# Patient Record
Sex: Female | Born: 1980 | Hispanic: No | Marital: Married | Smoking: Never smoker
Health system: Southern US, Community
[De-identification: ages and names within clinical notes are randomized; demographics above are authoritative.]

## PROBLEM LIST (undated history)

## (undated) ENCOUNTER — Inpatient Hospital Stay (HOSPITAL_COMMUNITY): Payer: Self-pay

## (undated) DIAGNOSIS — Z3490 Encounter for supervision of normal pregnancy, unspecified, unspecified trimester: Secondary | ICD-10-CM

## (undated) DIAGNOSIS — O24419 Gestational diabetes mellitus in pregnancy, unspecified control: Secondary | ICD-10-CM

## (undated) DIAGNOSIS — Z789 Other specified health status: Secondary | ICD-10-CM

## (undated) DIAGNOSIS — E785 Hyperlipidemia, unspecified: Secondary | ICD-10-CM

## (undated) HISTORY — PX: NO PAST SURGERIES: SHX2092

## (undated) HISTORY — DX: Other specified health status: Z78.9

## (undated) HISTORY — DX: Gestational diabetes mellitus in pregnancy, unspecified control: O24.419

---

## 2012-02-01 ENCOUNTER — Encounter (HOSPITAL_COMMUNITY): Payer: Self-pay | Admitting: *Deleted

## 2012-02-01 ENCOUNTER — Emergency Department (HOSPITAL_COMMUNITY)
Admission: EM | Admit: 2012-02-01 | Discharge: 2012-02-01 | Disposition: A | Discharge status: Home / Self Care | Payer: Medicaid Other | Attending: Emergency Medicine | Admitting: Emergency Medicine

## 2012-02-01 DIAGNOSIS — M549 Dorsalgia, unspecified: Secondary | ICD-10-CM

## 2012-02-01 DIAGNOSIS — E785 Hyperlipidemia, unspecified: Secondary | ICD-10-CM | POA: Insufficient documentation

## 2012-02-01 HISTORY — DX: Hyperlipidemia, unspecified: E78.5

## 2012-02-01 MED ORDER — OXYCODONE-ACETAMINOPHEN 5-325 MG PO TABS: 2.0000 | ORAL_TABLET | ORAL | Status: AC | PRN

## 2012-02-01 MED ORDER — PREDNISONE 20 MG PO TABS: 40.0000 mg | ORAL_TABLET | Freq: Every day | ORAL | Status: AC

## 2012-02-01 MED ORDER — IBUPROFEN 600 MG PO TABS: 600.0000 mg | ORAL_TABLET | Freq: Four times a day (QID) | ORAL | Status: AC | PRN

## 2012-02-01 NOTE — Discharge Instructions (Signed)
Back Pain, Adult Low back pain is very common. About 1 in 5 people have back pain.The cause of low back pain is rarely dangerous. The pain often gets better over time.About half of people with a sudden onset of back pain feel better in just 2 weeks. About 8 in 10 people feel better by 6 weeks.  CAUSES Some common causes of back pain include:  Strain of the muscles or ligaments supporting the spine.   Wear and tear (degeneration) of the spinal discs.   Arthritis.   Direct injury to the back.  DIAGNOSIS Most of the time, the direct cause of low back pain is not known.However, back pain can be treated effectively even when the exact cause of the pain is unknown.Answering your caregiver's questions about your overall health and symptoms is one of the most accurate ways to make sure the cause of your pain is not dangerous. If your caregiver needs more information, he or she may order lab work or imaging tests (X-rays or MRIs).However, even if imaging tests show changes in your back, this usually does not require surgery. HOME CARE INSTRUCTIONS For many people, back pain returns.Since low back pain is rarely dangerous, it is often a condition that people can learn to manageon their own.   Remain active. It is stressful on the back to sit or stand in one place. Do not sit, drive, or stand in one place for more than 30 minutes at a time. Take short walks on level surfaces as soon as pain allows.Try to increase the length of time you walk each day.   Do not stay in bed.Resting more than 1 or 2 days can delay your recovery.   Do not avoid exercise or work.Your body is made to move.It is not dangerous to be active, even though your back may hurt.Your back will likely heal faster if you return to being active before your pain is gone.   Pay attention to your body when you bend and lift. Many people have less discomfortwhen lifting if they bend their knees, keep the load close to their  bodies,and avoid twisting. Often, the most comfortable positions are those that put less stress on your recovering back.   Find a comfortable position to sleep. Use a firm mattress and lie on your side with your knees slightly bent. If you lie on your back, put a pillow under your knees.   Only take over-the-counter or prescription medicines as directed by your caregiver. Over-the-counter medicines to reduce pain and inflammation are often the most helpful.Your caregiver may prescribe muscle relaxant drugs.These medicines help dull your pain so you can more quickly return to your normal activities and healthy exercise.   Put ice on the injured area.   Put ice in a plastic bag.   Place a towel between your skin and the bag.   Leave the ice on for 15 to 20 minutes, 3 to 4 times a day for the first 2 to 3 days. After that, ice and heat may be alternated to reduce pain and spasms.   Ask your caregiver about trying back exercises and gentle massage. This may be of some benefit.   Avoid feeling anxious or stressed.Stress increases muscle tension and can worsen back pain.It is important to recognize when you are anxious or stressed and learn ways to manage it.Exercise is a great option.  SEEK MEDICAL CARE IF:  You have pain that is not relieved with rest or medicine.   You have   pain that does not improve in 1 week.   You have new symptoms.   You are generally not feeling well.  SEEK IMMEDIATE MEDICAL CARE IF:   You have pain that radiates from your back into your legs.   You develop new bowel or bladder control problems.   You have unusual weakness or numbness in your arms or legs.   You develop nausea or vomiting.   You develop abdominal pain.   You feel faint.  Document Released: 07/26/2005 Document Revised: 07/15/2011 Document Reviewed: 12/14/2010 [employer] Patient Information 2012 [employer]. 

## 2012-02-01 NOTE — ED Provider Notes (Signed)
History   This chart was scribed for Nelia Shi, MD by Sofie Rower. The patient was seen in room TR07C/TR07C and the patient's care was started at 1:20 PM     CSN: [id number]  Arrival date & time 02/01/12  1132   None     Chief Complaint  Patient presents with  . Back Pain    x 2 months    (Consider location/radiation/quality/duration/timing/severity/associated sxs/prior treatment) Patient is a 31 y.o. female presenting with back pain. The history is provided by the patient. No language interpreter was used.  Back Pain  This is a new problem. The problem occurs constantly. The problem has not changed since onset.The pain is associated with no known injury. The pain is present in the lumbar spine. The quality of the pain is described as shooting. The pain radiates to the right thigh. The pain is moderate. The symptoms are aggravated by certain positions. The pain is the same all the time. Pertinent negatives include no chest pain, no fever and no numbness. The treatment provided no relief.    Samantha Lamb is a 31 y.o. female who presents to the Emergency Department complaining of moderate, episodic back pain located at the right lower back onset two months ago with associated symptoms of radiating right leg pain. The pt informs the EDP that the pain is worse in the morning. Modifying factors include urinating which intensifies the back pain,  movement of the right leg to certain positions which intensifies the pain, taking Vicodin (Rx in [facility]), prednisone which provide moderate relief. Pt is visiting [place] for two months, pt is from [place]. Pt is due to move here to [place] shortly, her husband has just been employed, given a temporary position.   Pt denies having ulcers.    Past Medical History  Diagnosis Date  . Hyperlipemia      History  Substance Use Topics  . Smoking status: Not on file  . Smokeless tobacco: Not on file  . Alcohol Use: No    OB History    Grav Para Term  Preterm Abortions TAB SAB Ect Mult Living                  Review of Systems  Constitutional: Negative for fever.  Cardiovascular: Negative for chest pain.  Musculoskeletal: Positive for back pain.  Neurological: Negative for numbness.  All other systems reviewed and are negative.    Allergies  Penicillins  Home Medications   Current Outpatient Rx  Name Route Sig Dispense Refill  . PREDNISONE 10 MG PO TABS Oral Take 10 mg by mouth daily. Took 6 tablets day 1, 5 tablets day 2, 4 tablets day 3, 2 tablets day 4, and 1 tablet day 5.  On day 4 today    . IBUPROFEN 600 MG PO TABS Oral Take 1 tablet (600 mg total) by mouth every 6 (six) hours as needed for pain. 30 tablet 0  . OXYCODONE-ACETAMINOPHEN 5-325 MG PO TABS Oral Take 2 tablets by mouth every 4 (four) hours as needed for pain. 6 tablet 0    BP 103/64  Pulse 127  Temp 98.1 F (36.7 C) (Oral)  Resp 20  SpO2 97%  LMP 01/01/2012  Physical Exam  Nursing note and vitals reviewed. Constitutional: She is oriented to person, place, and time. She appears well-developed and well-nourished. No distress.  HENT:  Head: Normocephalic and atraumatic.  Eyes: Pupils are equal, round, and reactive to light.  Neck: Normal range of  motion.  Cardiovascular: Normal rate and intact distal pulses.   Pulmonary/Chest: No respiratory distress.  Abdominal: Normal appearance. She exhibits no distension.  Musculoskeletal: Normal range of motion.       Equal 2+ strength dorsiflex bilaterally,  Neurological: She is alert and oriented to person, place, and time. No cranial nerve deficit.  Skin: Skin is warm and dry. No rash noted.  Psychiatric: She has a normal mood and affect. Her behavior is normal.    ED Course  Procedures (including critical care time)  DIAGNOSTIC STUDIES: Oxygen Saturation is 97% on room air, normal by my interpretation.    COORDINATION OF CARE:     Labs Reviewed - No data to display No results found.   1. Back  pain       MDM        I personally performed the services described in this documentation, which was scribed in my presence. The recorded information has been reviewed and considered.     Nelia Shi, MD 02/01/12 1331

## 2012-02-01 NOTE — ED Notes (Signed)
Patient states right lower back pain radiating down into right leg, x 2 months with worsening condition over past few days

## 2012-02-28 ENCOUNTER — Ambulatory Visit: Payer: Self-pay | Attending: Orthopedic Surgery | Admitting: Rehabilitation

## 2012-02-28 DIAGNOSIS — M5430 Sciatica, unspecified side: Secondary | ICD-10-CM | POA: Insufficient documentation

## 2012-02-28 DIAGNOSIS — M2569 Stiffness of other specified joint, not elsewhere classified: Secondary | ICD-10-CM | POA: Insufficient documentation

## 2012-02-28 DIAGNOSIS — IMO0001 Reserved for inherently not codable concepts without codable children: Secondary | ICD-10-CM | POA: Insufficient documentation

## 2012-02-28 DIAGNOSIS — M79609 Pain in unspecified limb: Secondary | ICD-10-CM | POA: Insufficient documentation

## 2012-03-06 ENCOUNTER — Ambulatory Visit: Payer: Self-pay | Admitting: Rehabilitation

## 2012-03-20 ENCOUNTER — Ambulatory Visit: Payer: Medicaid Other | Attending: Orthopedic Surgery | Admitting: Rehabilitation

## 2012-03-20 DIAGNOSIS — M79609 Pain in unspecified limb: Secondary | ICD-10-CM | POA: Insufficient documentation

## 2012-03-20 DIAGNOSIS — M5430 Sciatica, unspecified side: Secondary | ICD-10-CM | POA: Insufficient documentation

## 2012-03-20 DIAGNOSIS — IMO0001 Reserved for inherently not codable concepts without codable children: Secondary | ICD-10-CM | POA: Insufficient documentation

## 2012-03-20 DIAGNOSIS — M2569 Stiffness of other specified joint, not elsewhere classified: Secondary | ICD-10-CM | POA: Insufficient documentation

## 2012-03-22 ENCOUNTER — Ambulatory Visit: Payer: Medicaid Other | Admitting: Rehabilitation

## 2012-04-04 ENCOUNTER — Ambulatory Visit: Payer: Medicaid Other | Admitting: Rehabilitation

## 2012-04-12 ENCOUNTER — Ambulatory Visit: Payer: Medicaid Other | Attending: Orthopedic Surgery | Admitting: Rehabilitation

## 2012-04-12 DIAGNOSIS — M5430 Sciatica, unspecified side: Secondary | ICD-10-CM | POA: Insufficient documentation

## 2012-04-12 DIAGNOSIS — M2569 Stiffness of other specified joint, not elsewhere classified: Secondary | ICD-10-CM | POA: Insufficient documentation

## 2012-04-12 DIAGNOSIS — M79609 Pain in unspecified limb: Secondary | ICD-10-CM | POA: Insufficient documentation

## 2012-04-12 DIAGNOSIS — IMO0001 Reserved for inherently not codable concepts without codable children: Secondary | ICD-10-CM | POA: Insufficient documentation

## 2014-02-05 ENCOUNTER — Emergency Department (HOSPITAL_COMMUNITY)
Admission: EM | Admit: 2014-02-05 | Discharge: 2014-02-05 | Disposition: A | Discharge status: Home / Self Care | Payer: 59 | Attending: Emergency Medicine | Admitting: Emergency Medicine

## 2014-02-05 ENCOUNTER — Emergency Department (HOSPITAL_COMMUNITY): Payer: 59

## 2014-02-05 ENCOUNTER — Encounter (HOSPITAL_COMMUNITY): Payer: Self-pay | Admitting: Emergency Medicine

## 2014-02-05 DIAGNOSIS — R1013 Epigastric pain: Secondary | ICD-10-CM | POA: Diagnosis not present

## 2014-02-05 DIAGNOSIS — R42 Dizziness and giddiness: Secondary | ICD-10-CM | POA: Insufficient documentation

## 2014-02-05 DIAGNOSIS — Z3202 Encounter for pregnancy test, result negative: Secondary | ICD-10-CM | POA: Insufficient documentation

## 2014-02-05 DIAGNOSIS — R079 Chest pain, unspecified: Secondary | ICD-10-CM | POA: Diagnosis present

## 2014-02-05 DIAGNOSIS — K3189 Other diseases of stomach and duodenum: Secondary | ICD-10-CM | POA: Insufficient documentation

## 2014-02-05 DIAGNOSIS — M549 Dorsalgia, unspecified: Secondary | ICD-10-CM | POA: Insufficient documentation

## 2014-02-05 DIAGNOSIS — R0602 Shortness of breath: Secondary | ICD-10-CM | POA: Insufficient documentation

## 2014-02-05 DIAGNOSIS — Z8639 Personal history of other endocrine, nutritional and metabolic disease: Secondary | ICD-10-CM | POA: Insufficient documentation

## 2014-02-05 DIAGNOSIS — Z862 Personal history of diseases of the blood and blood-forming organs and certain disorders involving the immune mechanism: Secondary | ICD-10-CM | POA: Insufficient documentation

## 2014-02-05 LAB — PREGNANCY, URINE: PREG TEST UR: NEGATIVE

## 2014-02-05 LAB — COMPREHENSIVE METABOLIC PANEL
ALBUMIN: 3.7 g/dL (ref 3.5–5.2)
ALK PHOS: 76 U/L (ref 39–117)
ALT: 12 U/L (ref 0–35)
AST: 33 U/L (ref 0–37)
BUN: 13 mg/dL (ref 6–23)
CO2: 19 mEq/L (ref 19–32)
Calcium: 8.8 mg/dL (ref 8.4–10.5)
Chloride: 102 mEq/L (ref 96–112)
Creatinine, Ser: 0.58 mg/dL (ref 0.50–1.10)
GFR calc Af Amer: >90 mL/min (ref >90)
GFR calc non Af Amer: >90 mL/min (ref >90)
Glucose, Bld: 131 mg/dL — ABNORMAL HIGH (ref 70–99)
POTASSIUM: 5.2 meq/L (ref 3.7–5.3)
Sodium: 138 mEq/L (ref 137–147)
TOTAL PROTEIN: 7.8 g/dL (ref 6.0–8.3)
Total Bilirubin: <0.2 mg/dL — ABNORMAL LOW (ref 0.3–1.2)

## 2014-02-05 LAB — CBC WITH DIFFERENTIAL/PLATELET
BASOS ABS: 0 10*3/uL (ref 0.0–0.1)
BASOS PCT: 0 % (ref 0–1)
EOS ABS: 0.2 10*3/uL (ref 0.0–0.7)
Eosinophils Relative: 2 % (ref 0–5)
HCT: 38.9 % (ref 36.0–46.0)
HEMOGLOBIN: 13.4 g/dL (ref 12.0–15.0)
Lymphocytes Relative: 35 % (ref 12–46)
Lymphs Abs: 3.3 10*3/uL (ref 0.7–4.0)
MCH: 27.9 pg (ref 26.0–34.0)
MCHC: 34.4 g/dL (ref 30.0–36.0)
MCV: 81 fL (ref 78.0–100.0)
MONOS PCT: 4 % (ref 3–12)
Monocytes Absolute: 0.4 10*3/uL (ref 0.1–1.0)
NEUTROS ABS: 5.6 10*3/uL (ref 1.7–7.7)
NEUTROS PCT: 59 % (ref 43–77)
PLATELETS: 283 10*3/uL (ref 150–400)
RBC: 4.8 MIL/uL (ref 3.87–5.11)
RDW: 14.5 % (ref 11.5–15.5)
WBC: 9.5 10*3/uL (ref 4.0–10.5)

## 2014-02-05 LAB — URINALYSIS, ROUTINE W REFLEX MICROSCOPIC
Bilirubin Urine: NEGATIVE
Glucose, UA: NEGATIVE mg/dL
HGB URINE DIPSTICK: NEGATIVE
Ketones, ur: NEGATIVE mg/dL
Leukocytes, UA: NEGATIVE
NITRITE: NEGATIVE
PH: 5 (ref 5.0–8.0)
Protein, ur: NEGATIVE mg/dL
SPECIFIC GRAVITY, URINE: 1.012 (ref 1.005–1.030)
UROBILINOGEN UA: 0.2 mg/dL (ref 0.0–1.0)

## 2014-02-05 LAB — LIPASE, BLOOD: LIPASE: 32 U/L (ref 11–59)

## 2014-02-05 LAB — TROPONIN I

## 2014-02-05 MED ORDER — OMEPRAZOLE 20 MG PO CPDR: 20.0000 mg | DELAYED_RELEASE_CAPSULE | Freq: Every day | ORAL | Status: DC

## 2014-02-05 MED ORDER — GI COCKTAIL ~~LOC~~
30.0000 mL | Freq: Once | ORAL | Status: AC
  Administered 2014-02-05: 30 mL via ORAL
  Filled 2014-02-05: qty 30

## 2014-02-05 NOTE — ED Notes (Signed)
PA at bedside.

## 2014-02-05 NOTE — ED Provider Notes (Signed)
CSN: [id number]     Arrival date & time 02/05/14  0825 History   First MD Initiated Contact with Patient 02/05/14 [phone]     Chief Complaint  Patient presents with  . Dizziness  . Chest Pain  . Back Pain     (Consider location/radiation/quality/duration/timing/severity/associated sxs/prior Treatment) Patient is a 33 y.o. female presenting with chest pain. The history is provided by the patient. No language interpreter was used.  Chest Pain Pain location:  L chest Pain quality: aching   Pain radiates to:  L arm Pain radiates to the back: yes   Pain severity:  Mild Onset quality:  Gradual Associated symptoms: back pain, dizziness, nausea and shortness of breath   Associated symptoms: no abdominal pain, no cough, no fever and not vomiting   Associated symptoms comment:  She complains of chest pain since this morning when she got up. No alleviating or aggravating factors. No fever or cough. She describes the pain as burning type pain. She denies history of similar symptoms or of known heart conditions.    Past Medical History  Diagnosis Date  . Hyperlipemia    History reviewed. No pertinent past surgical history. Family History  Problem Relation Age of Onset  . Diabetes Mother   . Diabetes Father   . Heart failure Father    History  Substance Use Topics  . Smoking status: Never Smoker   . Smokeless tobacco: Not on file  . Alcohol Use: Not on file   OB History   Grav Para Term Preterm Abortions TAB SAB Ect Mult Living           1 3     Review of Systems  Constitutional: Negative for fever and chills.  HENT: Negative.   Respiratory: Positive for shortness of breath. Negative for cough.   Cardiovascular: Positive for chest pain.  Gastrointestinal: Positive for nausea and abdominal distention. Negative for vomiting, abdominal pain and diarrhea.  Genitourinary: Negative.  Negative for dysuria.  Musculoskeletal: Positive for back pain.  Skin: Negative.   Neurological:  Positive for dizziness and light-headedness. Negative for syncope.      Allergies  Penicillins  Home Medications   Prior to Admission medications   Not on File   BP 103/82  Pulse 84  Temp(Src) 98.4 F (36.9 C) (Oral)  Resp 19  Ht 5\' 3"  (1.6 m)  Wt 172 lb (78.019 kg)  BMI 30.48 kg/m2  SpO2 99% Physical Exam  Constitutional: She is oriented to person, place, and time. She appears well-developed and well-nourished.  HENT:  Head: Normocephalic.  Neck: Normal range of motion. Neck supple.  Cardiovascular: Normal rate and regular rhythm.   No murmur heard. Pulmonary/Chest: Effort normal and breath sounds normal. She has no wheezes. She has no rales. She exhibits no tenderness.  Abdominal: Soft. Bowel sounds are normal. There is no tenderness. There is no rebound and no guarding.  Musculoskeletal: Normal range of motion. She exhibits no edema.  Neurological: She is alert and oriented to person, place, and time.  Skin: Skin is warm and dry. No rash noted.  Psychiatric: She has a normal mood and affect.    ED Course  Procedures (including critical care time) Labs Review Labs Reviewed  CBC WITH DIFFERENTIAL  COMPREHENSIVE METABOLIC PANEL  LIPASE, BLOOD  TROPONIN I  URINALYSIS, ROUTINE W REFLEX MICROSCOPIC  PREGNANCY, URINE   Results for orders placed during the hospital encounter of 02/05/14  CBC WITH DIFFERENTIAL      Result Value Ref  Range   WBC 9.5  4.0 - 10.5 K/uL   RBC 4.80  3.87 - 5.11 MIL/uL   Hemoglobin 13.4  12.0 - 15.0 g/dL   HCT 40.9  81.1 - 91.4 %   MCV 81.0  78.0 - 100.0 fL   MCH 27.9  26.0 - 34.0 pg   MCHC 34.4  30.0 - 36.0 g/dL   RDW 78.2  95.6 - 21.3 %   Platelets 283  150 - 400 K/uL   Neutrophils Relative % 59  43 - 77 %   Neutro Abs 5.6  1.7 - 7.7 K/uL   Lymphocytes Relative 35  12 - 46 %   Lymphs Abs 3.3  0.7 - 4.0 K/uL   Monocytes Relative 4  3 - 12 %   Monocytes Absolute 0.4  0.1 - 1.0 K/uL   Eosinophils Relative 2  0 - 5 %   Eosinophils  Absolute 0.2  0.0 - 0.7 K/uL   Basophils Relative 0  0 - 1 %   Basophils Absolute 0.0  0.0 - 0.1 K/uL  COMPREHENSIVE METABOLIC PANEL      Result Value Ref Range   Sodium 138  137 - 147 mEq/L   Potassium 5.2  3.7 - 5.3 mEq/L   Chloride 102  96 - 112 mEq/L   CO2 19  19 - 32 mEq/L   Glucose, Bld 131 (*) 70 - 99 mg/dL   BUN 13  6 - 23 mg/dL   Creatinine, Ser 0.86  0.50 - 1.10 mg/dL   Calcium 8.8  8.4 - 57.8 mg/dL   Total Protein 7.8  6.0 - 8.3 g/dL   Albumin 3.7  3.5 - 5.2 g/dL   AST 33  0 - 37 U/L   ALT 12  0 - 35 U/L   Alkaline Phosphatase 76  39 - 117 U/L   Total Bilirubin <0.2 (*) 0.3 - 1.2 mg/dL   GFR calc non Af Amer >90  >90 mL/min   GFR calc Af Amer >90  >90 mL/min  LIPASE, BLOOD      Result Value Ref Range   Lipase 32  11 - 59 U/L  TROPONIN I      Result Value Ref Range   Troponin I <0.30  <0.30 ng/mL  URINALYSIS, ROUTINE W REFLEX MICROSCOPIC      Result Value Ref Range   Color, Urine YELLOW  YELLOW   APPearance CLEAR  CLEAR   Specific Gravity, Urine 1.012  1.005 - 1.030   pH 5.0  5.0 - 8.0   Glucose, UA NEGATIVE  NEGATIVE mg/dL   Hgb urine dipstick NEGATIVE  NEGATIVE   Bilirubin Urine NEGATIVE  NEGATIVE   Ketones, ur NEGATIVE  NEGATIVE mg/dL   Protein, ur NEGATIVE  NEGATIVE mg/dL   Urobilinogen, UA 0.2  0.0 - 1.0 mg/dL   Nitrite NEGATIVE  NEGATIVE   Leukocytes, UA NEGATIVE  NEGATIVE  PREGNANCY, URINE      Result Value Ref Range   Preg Test, Ur NEGATIVE  NEGATIVE    Imaging Review No results found.   EKG Interpretation   Date/Time:  Tuesday February 05 2014 09:11:32 EDT Ventricular Rate:  94 PR Interval:  133 QRS Duration: 88 QT Interval:  357 QTC Calculation: 446 R Axis:   62 Text Interpretation:  Sinus rhythm Normal ECG No old tracing to compare  Confirmed by Ethelda Chick  MD, SAM [id number]) on 02/05/2014 9:20:20 AM      MDM   Final diagnoses:  None  1. Nonspecific chest pain 2. Dyspepsia  Labs, CXR negative, symptoms of chest pain atypical -  reassuring. Heart Score = 0. Normal vital signs and no SOB or pleuritic component to chest discomfort. Doubt PE. GI Cocktail with relief of abdominal discomfort but not chest pain. Negative pregnancy - ectopic pregnancy not a cause of lightheadedness. She is ambulatory here without recurrent symptoms. Feel she can be discharged home to follow up with primary care outpatient.      Arnoldo HookerShari A Upstill, PA-C 02/14/14 0405

## 2014-02-05 NOTE — ED Notes (Signed)
33 yo female woke up this morning with left sided Chest pain with radiation to left arm, back pain, diaphoresis, SOB. Also reports bloating and abdominal discomfort. Describes it at aching and burning. Pt denies fever. Pt is a/o X3 HX of elevated cholesterol and chest pains.

## 2014-02-05 NOTE — ED Provider Notes (Signed)
Patient complains anterior chest pain going to left arm onset this morning at 7:30 AM. Last a split second and resolve spontaneously had several episodes today. Presently asymptomatic on exam no distress Glasgow Coma Score 15 breathing normally. Pain felt to be highly atypical for acute coronary syndrome in this 33 year old menstruating female  Doug SouSam Jacubowitz, MD 02/05/14 1155

## 2014-02-05 NOTE — ED Notes (Signed)
Patient transported to X-ray 

## 2014-02-05 NOTE — Discharge Instructions (Signed)
Chest Pain (Nonspecific) °It is often hard to give a specific diagnosis for the cause of chest pain. There is always a chance that your pain could be related to something serious, such as a heart attack or a blood clot in the lungs. You need to follow up with your health care provider for further evaluation. °CAUSES  °· Heartburn. °· Pneumonia or bronchitis. °· Anxiety or stress. °· Inflammation around your heart (pericarditis) or lung (pleuritis or pleurisy). °· A blood clot in the lung. °· A collapsed lung (pneumothorax). It can develop suddenly on its own (spontaneous pneumothorax) or from trauma to the chest. °· Shingles infection (herpes zoster virus). °The chest wall is composed of bones, muscles, and cartilage. Any of these can be the source of the pain. °· The bones can be bruised by injury. °· The muscles or cartilage can be strained by coughing or overwork. °· The cartilage can be affected by inflammation and become sore (costochondritis). °DIAGNOSIS  °Lab tests or other studies may be needed to find the cause of your pain. Your health care provider may have you take a test called an ambulatory electrocardiogram (ECG). An ECG records your heartbeat patterns over a 24-hour period. You may also have other tests, such as: °· Transthoracic echocardiogram (TTE). During echocardiography, sound waves are used to evaluate how blood flows through your heart. °· Transesophageal echocardiogram (TEE). °· Cardiac monitoring. This allows your health care provider to monitor your heart rate and rhythm in real time. °· Holter monitor. This is a portable device that records your heartbeat and can help diagnose heart arrhythmias. It allows your health care provider to track your heart activity for several days, if needed. °· Stress tests by exercise or by giving medicine that makes the heart beat faster. °TREATMENT  °· Treatment depends on what may be causing your chest pain. Treatment may include: °¨ Acid blockers for  heartburn. °¨ Anti-inflammatory medicine. °¨ Pain medicine for inflammatory conditions. °¨ Antibiotics if an infection is present. °· You may be advised to change lifestyle habits. This includes stopping smoking and avoiding alcohol, caffeine, and chocolate. °· You may be advised to keep your head raised (elevated) when sleeping. This reduces the chance of acid going backward from your stomach into your esophagus. °Most of the time, nonspecific chest pain will improve within 2-3 days with rest and mild pain medicine.  °HOME CARE INSTRUCTIONS  °· If antibiotics were prescribed, take them as directed. Finish them even if you start to feel better. °· For the next few days, avoid physical activities that bring on chest pain. Continue physical activities as directed. °· Do not use any tobacco products, including cigarettes, chewing tobacco, or electronic cigarettes. °· Avoid drinking alcohol. °· Only take medicine as directed by your health care provider. °· Follow your health care provider's suggestions for further testing if your chest pain does not go away. °· Keep any follow-up appointments you made. If you do not go to an appointment, you could develop lasting (chronic) problems with pain. If there is any problem keeping an appointment, call to reschedule. °SEEK MEDICAL CARE IF:  °· Your chest pain does not go away, even after treatment. °· You have a rash with blisters on your chest. °· You have a fever. °SEEK IMMEDIATE MEDICAL CARE IF:  °· You have increased chest pain or pain that spreads to your arm, neck, jaw, back, or abdomen. °· You have shortness of breath. °· You have an increasing cough, or you cough   up blood.  You have severe back or abdominal pain.  You feel nauseous or vomit.  You have severe weakness.  You faint.  You have chills. This is an emergency. Do not wait to see if the pain will go away. Get medical help at once. Call your local emergency services (911 in [place] Do not drive  yourself to the hospital. MAKE SURE YOU:   Understand these instructions.  Will watch your condition.  Will get help right away if you are not doing well or get worse. Document Released: 05/05/2005 Document Revised: 07/31/2013 Document Reviewed: 02/29/2008 [facility] Patient Information 2015 [place], [place]. This information is not intended to replace advice given to you by your health care provider. Make sure you discuss any questions you have with your health care provider. Indigestion Indigestion is discomfort in the upper abdomen that is caused by underlying problems such as gastroesophageal reflux disease (GERD), ulcers, or gallbladder problems.  CAUSES  Indigestion can be caused by many things. Possible causes include:  Stomach acid in the esophagus.  Stomach infections, usually caused by the bacteria H. pylori.  Being overweight.  Hiatal hernia. This means part of the stomach pushes up through the diaphragm.  Overeating.  Emotional problems, such as stress, anxiety, or depression.  Poor nutrition.  Consuming too much alcohol, tobacco, or caffeine.  Consuming spicy foods, fats, peppermint, chocolate, tomato products, citrus, or fruit juices.  Medicines such as aspirin and other anti-inflammatory drugs, hormones, steroids, and thyroid medicines.  Gastroparesis. This is a condition in which the stomach does not empty properly.  Stomach cancer.  Pregnancy, due to an increase in hormone levels, a relaxation of muscles in the digestive tract, and pressure on the stomach from the growing fetus. SYMPTOMS   Uncomfortable feeling of fullness after eating.  Pain or burning sensation in the upper abdomen.  Bloating.  Belching and gas.  Nausea and vomiting.  Acidic taste in the mouth.  Burning sensation in the chest (heartburn). DIAGNOSIS  Your caregiver will review your medical history and perform a physical exam. Other tests, such as blood tests, stool tests, X-rays,  and other imaging scans, may be done to check for more serious problems. TREATMENT  Liquid antacids and other drugs may be given to block stomach acid secretion. Medicines that increase esophageal muscle tone may also be given to help reduce symptoms. If an infection is found, antibiotic medicine may be given. HOME CARE INSTRUCTIONS  Avoid foods and drinks that make your symptoms worse, such as:  Caffeine or alcoholic drinks.  Chocolate.  Peppermint or mint flavorings.  Garlic and onions.  Spicy foods.  Citrus fruits, such as oranges, lemons, or limes.  Tomato-based foods such as sauce, chili, salsa, and pizza.  Fried and fatty foods.  Avoid eating for the 3 hours prior to your bedtime.  Eat small, frequent meals instead of large meals.  Stop smoking if you smoke.  Maintain a healthy weight.  Wear loose-fitting clothing. Do not wear anything tight around your waist that causes pressure on your stomach.  Raise the head of your bed 4 to 8 inches with wood blocks to help you sleep. Extra pillows will not help.  Only take over-the-counter or prescription medicines as directed by your caregiver.  Do not take aspirin, ibuprofen, or other nonsteroidal anti-inflammatory drugs (NSAIDs). SEEK IMMEDIATE MEDICAL CARE IF:   You are not better after 2 days.  You have chest pressure or pain that radiates up into your neck, arms, back, jaw, or  upper abdomen.  You have difficulty swallowing.  You keep vomiting.  You have black or bloody stools.  You have a fever.  You have dizziness, fainting, difficulty breathing, or heavy sweating.  You have severe abdominal pain.  You lose weight without trying. MAKE SURE YOU:  Understand these instructions.  Will watch your condition.  Will get help right away if you are not doing well or get worse. Document Released: 09/02/2004 Document Revised: 10/18/2011 Document Reviewed: 03/10/2011 [facility] Patient Information 2015 [place]. This information is not intended to replace advice given to you by your health care provider. Make sure you discuss any questions you have with your health care provider.  Emergency Department Resource Guide 1) Find a Doctor and Pay Out of Pocket Although you won't have to find out who is covered by your insurance plan, it is a good idea to ask around and get recommendations. You will then need to call the office and see if the doctor you have chosen will accept you as a new patient and what types of options they offer for patients who are self-pay. Some doctors offer discounts or will set up payment plans for their patients who do not have insurance, but you will need to ask so you aren't surprised when you get to your appointment.  2) Contact Your Local Health Department Not all health departments have doctors that can see patients for sick visits, but many do, so it is worth a call to see if yours does. If you don't know where your local health department is, you can check in your phone book. The CDC also has a tool to help you locate your state's health department, and many state websites also have listings of all of their local health departments.  3) Find a Walk-in Clinic If your illness is not likely to be very severe or complicated, you may want to try a walk in clinic. These are popping up all over the country in pharmacies, drugstores, and shopping centers. They're usually staffed by nurse practitioners or physician assistants that have been trained to treat common illnesses and complaints. They're usually fairly quick and inexpensive. However, if you have serious medical issues or chronic medical problems, these are probably not your best option.  No Primary Care Doctor: - Call Health Connect at  [phone] - they can help you locate a primary care doctor that  accepts your insurance, provides certain services, etc. - Physician Referral Service- [phone]  Chronic Pain  Problems: Organization         Address  Phone   Notes  [facility]  [phone] Patients need to be referred by their primary care doctor.   Medication Assistance: Organization         Address  Phone   Notes  [facility] Medication [facility] [address] [place], [place] [address] [phone] --Must be a resident of [place] [place] -- Must have NO insurance coverage whatsoever (no Medicaid/ Medicare, etc.) -- The pt. MUST have a primary care doctor that directs their care regularly and follows them in the community   [facility]  [phone]   [employer]  [phone]    Agencies that provide inexpensive medical care: Organization         Address  Phone   Notes  [facility]  [phone]   [facility]    [phone]   [facility] [phone]  [address] [place], [place] [address] [phone]   [facility] [address], [place] [phone]   [facility]    [phone]   [facility]    [phone]   [facility]  [address], [place] Phone:  [phone], Fax:  [phone] Hours of Operation:  9 am - 6 pm, M-F.  Also accepts Medicaid/Medicare and self-pay.  [facility]  [address], [place] Phone: [phone]) [phone], Fax: [phone]. Hours of Operation:  8:30 am - 5:30 pm, M-F.  Also accepts Medicaid and self-pay.  [facility] [place] [address], [place] Phone: [phone]   [facility] [address], [place], [place] [phone], Ext. [phone] Mondays & Thursdays: 7-9 AM.  First 15 patients are seen on a first come, first serve basis.    Medicaid-accepting [facility] Providers:  Organization         Address  Phone   Notes  [facility] [address], [place] [phone] Also  accepts self-pay patients.  [facility] [address], [place]  [phone]   [facility] [address], [place] [phone]   [facility] [address] [place] [address], [place] [phone]   Renaye RakersVeita Bland [address], [place]   [phone] Only accepts [place] patients after they have their name applied to their card.   Self-Pay (no insurance) in [facility]:  Organization         Address  Phone   Notes  Sickle Cell Patients, [facility] [address], [place] [phone]) [phone]   [facility] [address], [place] [phone]   [facility] [place]  [address], [place] [phone]   [facility]/Dr. Osei-Bonsu  [address] [place] [address], [place] or [phone], [place] [phone] Phone number for both [place] and [place] locations is the same.  [facility] [address], [place] [phone]   [facility] [place] [address] [place] [address], [place] or [address] [phone]   [facility] [address], [place] [phone], phone; [phone]) [phone], fax Sees patients 1st and 3rd Saturday of every month.  Must not qualify for public or private insurance (i.e. Medicaid, Medicare, [place] Health Choice, Veterans' Benefits)  Household income should be no more than 200% of the poverty level The clinic cannot treat you if you are pregnant or think you are pregnant  Sexually transmitted diseases are not treated at the clinic.

## 2014-02-15 NOTE — ED Provider Notes (Signed)
Medical screening examination/treatment/procedure(s) were conducted as a shared visit with non-physician practitioner(s) and myself.  I personally evaluated the patient during the encounter.   EKG Interpretation   Date/Time:  Tuesday February 05 2014 09:11:32 EDT Ventricular Rate:  94 PR Interval:  133 QRS Duration: 88 QT Interval:  357 QTC Calculation: 446 R Axis:   62 Text Interpretation:  Sinus rhythm Normal ECG No old tracing to compare  Confirmed by Ethelda ChickJACUBOWITZ  MD, SAM [phone]([id number]) on 02/05/2014 9:20:20 AM       Doug SouSam Jacubowitz, MD 02/15/14 [phone]

## 2015-08-10 NOTE — L&D Delivery Note (Signed)
Delivery Note At 0133 a viable female was delivered via SVD (presentation: LOA ;  ).  APGAR: 8 ,9 ; weight pending  .   Placenta status: delivered intact via Tomasa BlaseSchultz , .  Cord: 3 vessel  with the following complications: none.   Anesthesia:  epidural Episiotomy:  n/a Lacerations:  1st degree perianal- not repaired Suture Repair: n/a Est. Blood Loss (mL):  150  Mom to postpartum.  Baby to Couplet care / Skin to Skin.  Clearance Cootsndrew Tyson 07/26/2016, 1:49 AM   Patient is a [id number] at 7528w1d who was admitted for IOL due to GDM A2/B.  She progressed with augmentation via Pit.  I was gloved and present for delivery in its entirety.  Second stage of labor progressed to SVD.  Mild decels during second stage noted.  Complications: none  Lacerations: 1st deg perineal- not repaired  EBL: 150cc  Fransisca Shawn, CNM 2:01 AM  07/26/2016

## 2016-01-29 ENCOUNTER — Emergency Department (HOSPITAL_COMMUNITY): Payer: 59

## 2016-01-29 ENCOUNTER — Emergency Department (HOSPITAL_COMMUNITY)
Admission: EM | Admit: 2016-01-29 | Discharge: 2016-01-29 | Disposition: A | Discharge status: Home / Self Care | Payer: 59 | Attending: Emergency Medicine | Admitting: Emergency Medicine

## 2016-01-29 ENCOUNTER — Encounter (HOSPITAL_COMMUNITY): Payer: Self-pay | Admitting: *Deleted

## 2016-01-29 DIAGNOSIS — Z3490 Encounter for supervision of normal pregnancy, unspecified, unspecified trimester: Secondary | ICD-10-CM

## 2016-01-29 DIAGNOSIS — O9A211 Injury, poisoning and certain other consequences of external causes complicating pregnancy, first trimester: Secondary | ICD-10-CM | POA: Insufficient documentation

## 2016-01-29 DIAGNOSIS — Y999 Unspecified external cause status: Secondary | ICD-10-CM | POA: Diagnosis not present

## 2016-01-29 DIAGNOSIS — Y92410 Unspecified street and highway as the place of occurrence of the external cause: Secondary | ICD-10-CM | POA: Insufficient documentation

## 2016-01-29 DIAGNOSIS — Z79899 Other long term (current) drug therapy: Secondary | ICD-10-CM | POA: Diagnosis not present

## 2016-01-29 DIAGNOSIS — M79603 Pain in arm, unspecified: Secondary | ICD-10-CM | POA: Insufficient documentation

## 2016-01-29 DIAGNOSIS — Y939 Activity, unspecified: Secondary | ICD-10-CM | POA: Diagnosis not present

## 2016-01-29 DIAGNOSIS — Z3A12 12 weeks gestation of pregnancy: Secondary | ICD-10-CM | POA: Insufficient documentation

## 2016-01-29 DIAGNOSIS — M545 Low back pain: Secondary | ICD-10-CM | POA: Insufficient documentation

## 2016-01-29 DIAGNOSIS — R109 Unspecified abdominal pain: Secondary | ICD-10-CM | POA: Insufficient documentation

## 2016-01-29 DIAGNOSIS — T1490XA Injury, unspecified, initial encounter: Secondary | ICD-10-CM

## 2016-01-29 HISTORY — DX: Encounter for supervision of normal pregnancy, unspecified, unspecified trimester: Z34.90

## 2016-01-29 MED ORDER — CYCLOBENZAPRINE HCL 5 MG PO TABS: 5.0000 mg | ORAL_TABLET | Freq: Three times a day (TID) | ORAL | Status: DC | PRN

## 2016-01-29 MED ORDER — CYCLOBENZAPRINE HCL 10 MG PO TABS
5.0000 mg | ORAL_TABLET | Freq: Once | ORAL | Status: AC
  Administered 2016-01-29: 5 mg via ORAL
  Filled 2016-01-29: qty 1

## 2016-01-29 MED ORDER — ACETAMINOPHEN 325 MG PO TABS
650.0000 mg | ORAL_TABLET | Freq: Once | ORAL | Status: AC
  Administered 2016-01-29: 650 mg via ORAL
  Filled 2016-01-29: qty 2

## 2016-01-29 NOTE — ED Notes (Signed)
Fetal heart tones located above pubis/hair line, mid line, rate of 160-165.  MD aware of same.  Patient is now going to ultrasound

## 2016-01-29 NOTE — ED Notes (Signed)
Spoke to rapid [facility], states pt they usually do not respond for pt less than 20 weeks. States to doppler and monitor in ED and to call again with any concerns.
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
## 2016-01-29 NOTE — ED Notes (Signed)
Spoke to [facility], states they usually do not respond for pt less than 20 weeks. States to doppler and monitor in ED and to call again with any concerns.
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
## 2016-03-29 ENCOUNTER — Encounter (HOSPITAL_COMMUNITY): Payer: Self-pay | Admitting: Nurse Practitioner

## 2016-03-29 LAB — OB RESULTS CONSOLE GC/CHLAMYDIA
Chlamydia: NEGATIVE
GC PROBE AMP, GENITAL: NEGATIVE

## 2016-03-29 LAB — OB RESULTS CONSOLE ABO/RH: RH TYPE: POSITIVE

## 2016-03-29 LAB — CYSTIC FIBROSIS DIAGNOSTIC STUDY: INTERPRETATION-CFDNA: NEGATIVE

## 2016-03-29 LAB — OB RESULTS CONSOLE PLATELET COUNT: Platelets: 257 10*3/uL

## 2016-03-29 LAB — OB RESULTS CONSOLE HIV ANTIBODY (ROUTINE TESTING): HIV: NONREACTIVE

## 2016-03-29 LAB — OB RESULTS CONSOLE VARICELLA ZOSTER ANTIBODY, IGG: VARICELLA IGG: IMMUNE

## 2016-03-29 LAB — OB RESULTS CONSOLE HEPATITIS B SURFACE ANTIGEN: Hepatitis B Surface Ag: NEGATIVE

## 2016-03-29 LAB — OB RESULTS CONSOLE HGB/HCT, BLOOD
HCT: 34 %
HEMOGLOBIN: 11.8 g/dL

## 2016-03-29 LAB — SICKLE CELL SCREEN: SICKLE CELL SCREEN: NORMAL

## 2016-03-29 LAB — GLUCOSE TOLERANCE, 1 HOUR: Glucose, 1 Hour GTT: 175

## 2016-03-29 LAB — OB RESULTS CONSOLE RPR: RPR: NONREACTIVE

## 2016-03-29 LAB — OB RESULTS CONSOLE ANTIBODY SCREEN: ANTIBODY SCREEN: NEGATIVE

## 2016-03-29 LAB — OB RESULTS CONSOLE RUBELLA ANTIBODY, IGM: Rubella: IMMUNE

## 2016-03-29 LAB — CYTOLOGY - PAP: PAP SMEAR: NEGATIVE

## 2016-03-31 LAB — GLUCOSE TOLERANCE, 3 HOURS
GLUCOSE 2 HOUR GTT: 156 mg/dL — AB (ref ?–140)
GLUCOSE 3 HOUR GTT: 115 mg/dL (ref ?–140)
GLUCOSE FASTING GTT: 120 mg/dL — AB (ref 80–110)
Glucose, GTT - 1 Hour: 221 mg/dL — AB (ref ?–200)

## 2016-04-02 ENCOUNTER — Encounter: Payer: Self-pay | Admitting: *Deleted

## 2016-04-05 ENCOUNTER — Other Ambulatory Visit: Payer: Self-pay

## 2016-04-05 ENCOUNTER — Encounter: Payer: Medicaid Other | Attending: Obstetrics and Gynecology | Admitting: Dietician

## 2016-04-05 ENCOUNTER — Ambulatory Visit: Payer: Medicaid Other | Admitting: *Deleted

## 2016-04-05 ENCOUNTER — Encounter (HOSPITAL_COMMUNITY): Payer: Self-pay

## 2016-04-05 ENCOUNTER — Ambulatory Visit (HOSPITAL_COMMUNITY)
Admission: RE | Admit: 2016-04-05 | Discharge: 2016-04-05 | Disposition: A | Discharge status: Home / Self Care | Payer: Medicaid Other | Source: Ambulatory Visit | Attending: Nurse Practitioner | Admitting: Nurse Practitioner

## 2016-04-05 DIAGNOSIS — Z713 Dietary counseling and surveillance: Secondary | ICD-10-CM | POA: Insufficient documentation

## 2016-04-05 DIAGNOSIS — O24419 Gestational diabetes mellitus in pregnancy, unspecified control: Secondary | ICD-10-CM

## 2016-04-05 DIAGNOSIS — O09529 Supervision of elderly multigravida, unspecified trimester: Secondary | ICD-10-CM | POA: Insufficient documentation

## 2016-04-05 MED ORDER — GLUCOSE BLOOD VI STRP: ORAL_STRIP | 12 refills | Status: DC

## 2016-04-05 MED ORDER — ACCU-CHEK FASTCLIX LANCETS MISC: 6 refills | Status: DC

## 2016-04-05 MED ORDER — ACCU-CHEK AVIVA PLUS W/DEVICE KIT: PACK | 1 refills | Status: DC

## 2016-04-05 NOTE — Progress Notes (Signed)
Genetic Counseling  High-Risk Gestation Note  Appointment Date:  04/05/2016 Referred By: Felipe DroneMontoya, Margareta, NP Date of Birth:  31-Jan-1981   Pregnancy History: G3P3 Estimated Date of Delivery: 08/01/16 Estimated Gestational Age: 5740w1d Attending: Alpha GulaPaul Whitecar, MD   Ms. Samantha Lamb was seen for genetic counseling because of a maternal age of 35 y.o..  She will be 35 years old at delivery.   In summary:  Discussed AMA and associated risk for fetal aneuploidy  Discussed options for screening  NIPS-declined  [facility]  Discussed diagnostic testing options  Amniocentesis-declined  Reviewed family history concerns  Discussed carrier screening options - declined (CF,SMA, Hemoglobinopathies)  They were counseled regarding maternal age and the association with risk for chromosome conditions due to nondisjunction with aging of the ova.  We reviewed chromosomes, nondisjunction, and the associated 1 in 111 risk for fetal aneuploidy related to a maternal age of 35 y.o. at 10840w1d gestation.  She was counseled that the risk for aneuploidy decreases as gestational age increases, accounting for those pregnancies which spontaneously abort.  We specifically discussed Down syndrome (trisomy 6321), trisomies 7313 and 8118, and sex chromosome aneuploidies (47,XXX and 47,XXY) including the common features and prognoses of each.   We reviewed available screening options including noninvasive prenatal screening (NIPS)/cell free DNA (cfDNA) screening and detailed ultrasound.  She was counseled that screening tests are used to modify a patient's a priori risk for aneuploidy, typically based on age. This estimate provides a pregnancy specific risk assessment. We reviewed the benefits and limitations of each option. Specifically, we discussed the conditions for which each test screens, the detection rates, and false positive rates of each. She was also counseled regarding  diagnostic testing via amniocentesis. We reviewed the approximate 1 in 300-500 risk for complications from amniocentesis, including spontaneous pregnancy loss. We discussed the possible results that the tests might provide including: positive, negative, unanticipated, and no result. Finally, they were counseled regarding the cost of each option and potential out of pocket expenses. After consideration of all the options, she declined NIPS and amniocentesis.   [facility]. We do not have results of that ultrasound at this time.  She understands that screening tests cannot rule out all birth defects or genetic syndromes. The patient was advised of this limitation and states she still does not want additional testing at this time.   Ms. Samantha Lamb was provided with written information regarding cystic fibrosis (CF), spinal muscular atrophy (SMA) and hemoglobinopathies including the carrier frequency, availability of carrier screening and prenatal diagnosis if indicated.  In addition, we discussed that CF and hemoglobinopathies are routinely screened for as part of the  newborn screening panel.  After further discussion, she declined screening for CF, SMA and hemoglobinopathies.  Both family histories were reviewed and found to be noncontributory for birth defects, intellectual disability, and known genetic conditions. Consanguinity was denied. Without further information regarding the provided family history, an accurate genetic risk cannot be calculated. Further genetic counseling is warranted if more information is obtained.  Ms. Samantha Lamb denied exposure to environmental toxins or chemical agents. She denied the use of alcohol, tobacco or street drugs. She denied significant viral illnesses during the course of her pregnancy. Her medical and surgical histories were noncontributory.   I counseled Ms. Samantha Lamb regarding the above risks and available  options.  The approximate face-to-face time with the genetic counselor was 35 minutes.  Clydie BraunKaren Ainsley Sanguinetti, MS,  Certified [employer] 04/05/2016

## 2016-04-05 NOTE — Progress Notes (Signed)
Diabetes Education: 04/05/16 Samantha Lamb is a 35 y/o lady. 374/663 , 35 year old and twins aged 36 yrs.  EDD: 07/28/2016.  Hx of GDM with twins.  Has positive family hx of diabetes.   Review of GDM and implications for her and the babies. Review of post delivery self-care measures to assist with the prevention of type 2 diabetes at a later date. Review of factors affecting blood glucose.   Recommended walking 30 minutes in the cooler part of the day.   Review of blood glucose monitoring procedure. Has [place] Medicaid and will need the Accu-Chek Aviva Plus meter. On informal testing, her post breakfast glucose reading at 2 hrs was 132 mg/dl.  She had had a potato and a whole banana. Instructed to test fasting and 2 hr post prandial glucose levels.  Record glucose results and to bring glucose log to all MD/clinic appointments. Review of the recommended diet.  Provided [place] handout "Nutrition Diabetes and Pregnancy" and yelow carb counting card. Will plan to follow as needed. Maggie Pia Jedlicka, RN, RD, LDN

## 2016-04-15 ENCOUNTER — Encounter: Payer: 59 | Admitting: Family

## 2016-04-15 DIAGNOSIS — O0993 Supervision of high risk pregnancy, unspecified, third trimester: Secondary | ICD-10-CM | POA: Insufficient documentation

## 2016-04-19 ENCOUNTER — Encounter: Payer: Self-pay | Admitting: Obstetrics and Gynecology

## 2016-04-19 ENCOUNTER — Ambulatory Visit (INDEPENDENT_AMBULATORY_CARE_PROVIDER_SITE_OTHER): Payer: Medicaid Other | Admitting: Obstetrics and Gynecology

## 2016-04-19 VITALS — BP 97/63 | HR 104 | Wt 173.0 lb

## 2016-04-19 DIAGNOSIS — O24419 Gestational diabetes mellitus in pregnancy, unspecified control: Secondary | ICD-10-CM | POA: Insufficient documentation

## 2016-04-19 DIAGNOSIS — Z23 Encounter for immunization: Secondary | ICD-10-CM

## 2016-04-19 DIAGNOSIS — O24410 Gestational diabetes mellitus in pregnancy, diet controlled: Secondary | ICD-10-CM

## 2016-04-19 DIAGNOSIS — O09213 Supervision of pregnancy with history of pre-term labor, third trimester: Secondary | ICD-10-CM

## 2016-04-19 DIAGNOSIS — Z8759 Personal history of other complications of pregnancy, childbirth and the puerperium: Secondary | ICD-10-CM

## 2016-04-19 DIAGNOSIS — O0992 Supervision of high risk pregnancy, unspecified, second trimester: Secondary | ICD-10-CM

## 2016-04-19 DIAGNOSIS — O09522 Supervision of elderly multigravida, second trimester: Secondary | ICD-10-CM

## 2016-04-19 DIAGNOSIS — Z8742 Personal history of other diseases of the female genital tract: Secondary | ICD-10-CM

## 2016-04-19 LAB — POCT URINALYSIS DIP (DEVICE)
GLUCOSE, UA: NEGATIVE mg/dL
Hgb urine dipstick: NEGATIVE
LEUKOCYTES UA: NEGATIVE
Nitrite: NEGATIVE
Protein, ur: NEGATIVE mg/dL
Urobilinogen, UA: 0.2 mg/dL (ref 0.0–1.0)
pH: 5.5 (ref 5.0–8.0)

## 2016-04-19 LAB — GLUCOSE TOLERANCE, 1 HOUR: Glucose, 1 Hour GTT: 175

## 2016-04-19 MED ORDER — ASPIRIN EC 81 MG PO TBEC: 81.0000 mg | DELAYED_RELEASE_TABLET | Freq: Every day | ORAL | 6 refills | Status: DC

## 2016-04-19 MED ORDER — GLUCOSE BLOOD VI STRP
ORAL_STRIP | 12 refills | Status: DC
Start: 2016-04-19 — End: 2016-07-25

## 2016-04-19 MED ORDER — ACCU-CHEK SOFTCLIX LANCET DEV MISC: 11 refills | Status: DC

## 2016-04-19 NOTE — Progress Notes (Signed)
   PRENATAL VISIT NOTE  Subjective:  Synetta FailKishwar Lamb is a 35 y.o. [phone] at 8255w1d being seen today [facility]  She is currently monitored for the following issues for this high-risk pregnancy and has Advanced maternal age in multigravida; Supervision of high risk pregnancy in second trimester; Diet controlled gestational diabetes mellitus in second trimester; History of prior pregnancy with SGA newborn; and High risk pregnancy due to history of preterm labor in third trimester on her problem list.  Patient reports no complaints.  Contractions: Not present. Vag. Bleeding: None.  Movement: Present. Denies leaking of fluid.   The following portions of the patient's history were reviewed and updated as appropriate: allergies, current medications, past family history, past medical history, past social history, past surgical history and problem list. Problem list updated.  Objective:   Vitals:   04/19/16 0815  BP: 97/63  Pulse: (!) 104  Weight: 173 lb (78.5 kg)    Fetal Status: Fetal Heart Rate (bpm): 161   Movement: Present     General:  Alert, oriented and cooperative. Patient is in no acute distress.  Skin: Skin is warm and dry. No rash noted.   Cardiovascular: Normal heart rate noted  Respiratory: Normal respiratory effort, no problems with respiration noted  Abdomen: Soft, gravid, appropriate for gestational age. Pain/Pressure: Absent     Pelvic:  Cervical exam deferred        Extremities: Normal range of motion.  Edema: None  Mental Status: Normal mood and affect. Normal behavior. Normal judgment and thought content.   Urinalysis: Urine Protein: Negative Urine Glucose: Negative  Assessment and Plan:  Pregnancy: G3P1103 at 2155w1d  1. Diet controlled gestational diabetes mellitus in second trimester Patient has not yet started checking her sugars as there was some confusion on how to get the lancets and test strips. She did have diabetes with previous  pregnancy with twins so will start with a modified diet and return in two weeks with a sugar log. She only required meds with her previous pregnancy when she was on bed rest.   Will start a baby ASA 81mg  daily.   - Lancet [employer] (ACCU-CHEK SOFTCLIX) lancets; Use as instructed (Patient not taking: Reported on 04/19/2016)  Dispense: 30 each; Refill: 11 - glucose blood test strip; Use as instructed (Patient not taking: Reported on 04/19/2016)  Dispense: 100 each; Refill: 12 - Flu Vaccine QUAD 36+ mos IM (Fluarix, Quad PF)  2. Supervision of high risk pregnancy in second trimester Routine care  3. History of prior pregnancy with SGA newborn Pt may need growth US. Will monitor fundal hieghts  4. High risk pregnancy due to history of preterm labor in third trimester Pt with history of pre-term labor but with twins. Her singleton gestation was born at term. Will not need 17-OHP given only pre-term birth with twins.   5. Advanced maternal age in multigravida, second trimester Pt is to late for quad screen. Anatomy scan unremarkable. Spoke with genetic counseling.   Preterm labor symptoms and general obstetric precautions including but not limited to vaginal bleeding, contractions, leaking of fluid and fetal movement were reviewed in detail with the patient. Please refer to After Visit Summary for other counseling recommendations.  Return in about 2 weeks (around 05/03/2016) for HROB.  Samantha SkeensNicholas Michael Kamarion Zagami, MD

## 2016-04-19 NOTE — Patient Instructions (Signed)
Gestational Diabetes Mellitus  Gestational diabetes mellitus, often simply referred to as gestational diabetes, is a type of diabetes that some women develop during pregnancy. In gestational diabetes, the pancreas does not make enough insulin (a hormone), the cells are less responsive to the insulin that is made (insulin resistance), or both. Normally, insulin moves sugars from food into the tissue cells. The tissue cells use the sugars for energy. The lack of insulin or the lack of normal response to insulin causes excess sugars to build up in the blood instead of going into the tissue cells. As a result, high blood sugar (hyperglycemia) develops. The effect of high sugar (glucose) levels can cause many problems.   RISK FACTORS  You have an increased chance of developing gestational diabetes if you have a family history of diabetes and also have one or more of the following risk factors:  · A body mass index over 30 (obesity).  · A previous pregnancy with gestational diabetes.  · An older age at the time of pregnancy.  If blood glucose levels are kept in the normal range during pregnancy, women can have a healthy pregnancy. If your blood glucose levels are not well controlled, there may be risks to you, your unborn baby (fetus), your labor and delivery, or your newborn baby.   SYMPTOMS   If symptoms are experienced, they are much like symptoms you would normally expect during pregnancy. The symptoms of gestational diabetes include:   · Increased thirst (polydipsia).  · Increased urination (polyuria).  · Increased urination during the night (nocturia).  · Weight loss. This weight loss may be rapid.  · Frequent, recurring infections.  · Tiredness (fatigue).  · Weakness.  · Vision changes, such as blurred vision.  · Fruity smell to your breath.  · Abdominal pain.  DIAGNOSIS  Diabetes is diagnosed when blood glucose levels are increased. Your blood glucose level may be checked by one or more of the following blood  tests:  · A fasting blood glucose test. You will not be allowed to eat for at least 8 hours before a blood sample is taken.  · A random blood glucose test. Your blood glucose is checked at any time of the day regardless of when you ate.  · An oral glucose tolerance test (OGTT). Your blood glucose is measured after you have not eaten (fasted) for 1-3 hours and then after you drink a glucose-containing beverage. Since the hormones that cause insulin resistance are highest at about 24-28 weeks of a pregnancy, an OGTT is usually performed during that time. If you have risk factors, you may be screened for undiagnosed type 2 diabetes at your first prenatal visit.  TREATMENT   Gestational diabetes should be managed first with diet and exercise. Medicines may be added only if they are needed.  · You will need to take diabetes medicine or insulin daily to keep blood glucose levels in the desired range.  · You will need to match insulin dosing with exercise and healthy food choices.  If you have gestational diabetes, your treatment goal is to maintain the following blood glucose levels:  · Before meals (preprandial): at or below 95 mg/dL.  · After meals (postprandial):    One hour after a meal: at or below 140 mg/dL.    Two hours after a meal: at or below 120 mg/dL.  If you have pre-existing type 1 or type 2 diabetes, your treatment goal is to maintain the following blood glucose levels:  · Before   meals, at bedtime, and overnight: 60-99 mg/dL.  · After meals: peak of 100-129 mg/dL.  HOME CARE INSTRUCTIONS   · Have your hemoglobin A1c level checked twice a year.  · Perform daily blood glucose monitoring as directed by your health care provider. It is common to perform frequent blood glucose monitoring.  · Monitor urine ketones when you are ill and as directed by your health care provider.  · Take your diabetes medicine and insulin as directed by your health care provider to maintain your blood glucose level in the desired  range.  ¨ Never run out of diabetes medicine or insulin. It is needed every day.  ¨ Adjust insulin based on your intake of carbohydrates. Carbohydrates can raise blood glucose levels but need to be included in your diet. Carbohydrates provide vitamins, minerals, and fiber which are an essential part of a healthy diet. Carbohydrates are found in fruits, vegetables, whole grains, dairy products, legumes, and foods containing added sugars.  · Eat healthy foods. Alternate 3 meals with 3 snacks.  · Maintain a healthy weight gain. The usual total expected weight gain varies according to your prepregnancy body mass index (BMI).  · Carry a medical alert card or wear your medical alert jewelry.  · Carry a 15-gram carbohydrate snack with you at all times to treat low blood glucose (hypoglycemia). Some examples of 15-gram carbohydrate snacks include:  ¨ Glucose tablets, 3 or 4.  ¨ Glucose gel, 15-gram tube.  ¨ Raisins, 2 tablespoons (24 g).  ¨ Jelly beans, 6.  ¨ Animal crackers, 8.  ¨ Fruit juice, regular soda, or low-fat milk, 4 ounces (120 mL).  ¨ Gummy treats, 9.  · Recognize hypoglycemia. Hypoglycemia during pregnancy occurs with blood glucose levels of 60 mg/dL and below. The risk for hypoglycemia increases when fasting or skipping meals, during or after intense exercise, and during sleep. Hypoglycemia symptoms can include:  ¨ Tremors or shakes.  ¨ Decreased ability to concentrate.  ¨ Sweating.  ¨ Increased heart rate.  ¨ Headache.  ¨ Dry mouth.  ¨ Hunger.  ¨ Irritability.  ¨ Anxiety.  ¨ Restless sleep.  ¨ Altered speech or coordination.  ¨ Confusion.  · Treat hypoglycemia promptly. If you are alert and able to safely swallow, follow the 15:15 rule:  ¨ Take 15-20 grams of rapid-acting glucose or carbohydrate. Rapid-acting options include glucose gel, glucose tablets, or 4 ounces (120 mL) of fruit juice, regular soda, or low-fat milk.  ¨ Check your blood glucose level 15 minutes after taking the glucose.  ¨ Take 15-20  grams more of glucose if the repeat blood glucose level is still 70 mg/dL or below.  ¨ Eat a meal or snack within 1 hour once blood glucose levels return to normal.  · Be alert to polyuria (excess urination) and polydipsia (excess thirst) which are early signs of hyperglycemia. An early awareness of hyperglycemia allows for prompt treatment. Treat hyperglycemia as directed by your health care provider.  · Engage in at least 30 minutes of physical activity a day or as directed by your health care provider. Ten minutes of physical activity timed 30 minutes after each meal is encouraged to control postprandial blood glucose levels.  · Adjust your insulin dosing and food intake as needed if you start a new exercise or sport.  · Follow your sick-day plan at any time you are unable to eat or drink as usual.  · Avoid tobacco and alcohol use.  · Keep all follow-up visits as directed   by your health care provider.  · Follow the advice of your health care provider regarding your prenatal and post-delivery (postpartum) appointments, meal planning, exercise, medicines, vitamins, blood tests, other medical tests, and physical activities.  · Perform daily skin and foot care. Examine your skin and feet daily for cuts, bruises, redness, nail problems, bleeding, blisters, or sores.  · Brush your teeth and gums at least twice a day and floss at least once a day. Follow up with your dentist regularly.  · Schedule an eye exam during the first trimester of your pregnancy or as directed by your health care provider.  · Share your diabetes management plan with your workplace or school.  · Stay up-to-date with immunizations.  · Learn to manage stress.  · Obtain ongoing diabetes education and support as needed.  · Learn about and consider breastfeeding your baby.  · You should have your blood sugar level checked 6-12 weeks after delivery. This is done with an oral glucose tolerance test (OGTT).  SEEK MEDICAL CARE IF:   · You are unable to  eat food or drink fluids for more than 6 hours.  · You have nausea and vomiting for more than 6 hours.  · You have a blood glucose level of 200 mg/dL and you have ketones in your urine.  · There is a change in mental status.  · You develop vision problems.  · You have a persistent headache.  · You have upper abdominal pain or discomfort.  · You develop an additional serious illness.  · You have diarrhea for more than 6 hours.  · You have been sick or have had a fever for a couple of days and are not getting better.  SEEK IMMEDIATE MEDICAL CARE IF:   · You have difficulty breathing.  · You no longer feel the baby moving.  · You are bleeding or have discharge from your vagina.  · You start having premature contractions or labor.  MAKE SURE YOU:  · Understand these instructions.  · Will watch your condition.  · Will get help right away if you are not doing well or get worse.     This information is not intended to replace advice given to you by your health care provider. Make sure you discuss any questions you have with your health care provider.     Document Released: 11/01/2000 Document Revised: 08/16/2014 Document Reviewed: 02/22/2012  Elsevier Interactive Patient Education ©2016 [employer]

## 2016-04-26 ENCOUNTER — Other Ambulatory Visit: Payer: Self-pay

## 2016-04-26 ENCOUNTER — Other Ambulatory Visit: Payer: Self-pay | Admitting: Obstetrics & Gynecology

## 2016-04-26 DIAGNOSIS — O24410 Gestational diabetes mellitus in pregnancy, diet controlled: Secondary | ICD-10-CM

## 2016-04-26 DIAGNOSIS — R7611 Nonspecific reaction to tuberculin skin test without active tuberculosis: Secondary | ICD-10-CM | POA: Insufficient documentation

## 2016-04-26 DIAGNOSIS — O0992 Supervision of high risk pregnancy, unspecified, second trimester: Secondary | ICD-10-CM

## 2016-04-26 MED ORDER — ACCU-CHEK FASTCLIX LANCETS MISC: 1.0000 [IU] | Freq: Four times a day (QID) | 12 refills | Status: DC

## 2016-04-26 MED ORDER — ACCU-CHEK SOFTCLIX LANCET DEV MISC: 11 refills | Status: DC

## 2016-04-26 NOTE — Progress Notes (Signed)
Received notification from pt's pharmacy in regards to Reordered lancets Rx due to provider not being within Medicaid.  Pt requested for the pharmacy to be changed to [facility] off [address]  Lancet Rx e-prescribed.

## 2016-05-04 ENCOUNTER — Ambulatory Visit (INDEPENDENT_AMBULATORY_CARE_PROVIDER_SITE_OTHER): Payer: Medicaid Other | Admitting: Obstetrics & Gynecology

## 2016-05-04 VITALS — BP 99/62 | HR 100 | Wt 173.4 lb

## 2016-05-04 DIAGNOSIS — O24419 Gestational diabetes mellitus in pregnancy, unspecified control: Secondary | ICD-10-CM

## 2016-05-04 DIAGNOSIS — O0992 Supervision of high risk pregnancy, unspecified, second trimester: Secondary | ICD-10-CM

## 2016-05-04 LAB — POCT URINALYSIS DIP (DEVICE)
BILIRUBIN URINE: NEGATIVE
GLUCOSE, UA: NEGATIVE mg/dL
Hgb urine dipstick: NEGATIVE
KETONES UR: NEGATIVE mg/dL
Nitrite: NEGATIVE
Protein, ur: 30 mg/dL — AB
SPECIFIC GRAVITY, URINE: 1.015 (ref 1.005–1.030)
Urobilinogen, UA: 1 mg/dL (ref 0.0–1.0)
pH: 8 (ref 5.0–8.0)

## 2016-05-04 MED ORDER — GLYBURIDE 2.5 MG PO TABS: 1.2500 mg | ORAL_TABLET | Freq: Every day | ORAL | 3 refills | Status: DC

## 2016-05-04 MED ORDER — PRENATAL VITAMINS 0.8 MG PO TABS: 1.0000 | ORAL_TABLET | Freq: Every day | ORAL | 12 refills | Status: DC

## 2016-05-04 NOTE — Patient Instructions (Addendum)
Return to clinic for any scheduled appointments or obstetric concerns, or go to [facility] for evaluation  Tdap Vaccine (Tetanus, Diphtheria and Pertussis): What You Need to Know 1. Why get vaccinated? Tetanus, diphtheria and pertussis are very serious diseases. Tdap vaccine can protect us from these diseases. And, Tdap vaccine given to pregnant women can protect newborn babies against pertussis. TETANUS (Lockjaw) is rare in the [place] today. It causes painful muscle tightening and stiffness, usually all over the body.  It can lead to tightening of muscles in the head and neck so you can't open your mouth, swallow, or sometimes even breathe. Tetanus kills about 1 out of 10 people who are infected even after receiving the best medical care. DIPHTHERIA is also rare in the [place] today. It can cause a thick coating to form in the back of the throat.  It can lead to breathing problems, heart failure, paralysis, and death. PERTUSSIS (Whooping Cough) causes severe coughing spells, which can cause difficulty breathing, vomiting and disturbed sleep.  It can also lead to weight loss, incontinence, and rib fractures. Up to 2 in 100 adolescents and 5 in 100 adults with pertussis are hospitalized or have complications, which could include pneumonia or death. These diseases are caused by bacteria. Diphtheria and pertussis are spread from person to person through secretions from coughing or sneezing. Tetanus enters the body through cuts, scratches, or wounds. Before vaccines, as many as 200,000 cases of diphtheria, 200,000 cases of pertussis, and hundreds of cases of tetanus, were reported in the [place] each year. Since vaccination began, reports of cases for tetanus and diphtheria have dropped by about 99% and for pertussis by about 80%. 2. Tdap vaccine Tdap vaccine can protect adolescents and adults from tetanus, diphtheria, and pertussis. One dose of Tdap is routinely given at age 11 or 12.  People who did not get Tdap at that age should get it as soon as possible. Tdap is especially important for healthcare professionals and anyone having close contact with a baby younger than 12 months. Pregnant women should get a dose of Tdap during every pregnancy, to protect the newborn from pertussis. Infants are most at risk for severe, life-threatening complications from pertussis. Another vaccine, called Td, protects against tetanus and diphtheria, but not pertussis. A Td booster should be given every 10 years. Tdap may be given as one of these boosters if you have never gotten Tdap before. Tdap may also be given after a severe cut or burn to prevent tetanus infection. Your doctor or the person giving you the vaccine can give you more information. Tdap may safely be given at the same time as other vaccines. 3. Some people should not get this vaccine  A person who has ever had a life-threatening allergic reaction after a previous dose of any diphtheria, tetanus or pertussis containing vaccine, OR has a severe allergy to any part of this vaccine, should not get Tdap vaccine. Tell the person giving the vaccine about any severe allergies.  Anyone who had coma or long repeated seizures within 7 days after a childhood dose of DTP or DTaP, or a previous dose of Tdap, should not get Tdap, unless a cause other than the vaccine was found. They can still get Td.  Talk to your doctor if you:  have seizures or another nervous system problem,  had severe pain or swelling after any vaccine containing diphtheria, tetanus or pertussis,  ever had a condition called Guillain-Barr Syndrome (GBS),  aren't feeling   well on the day the shot is scheduled. 4. Risks With any medicine, including vaccines, there is a chance of side effects. These are usually mild and go away on their own. Serious reactions are also possible but are rare. Most people who get Tdap vaccine do not have any problems with it. Mild  problems following Tdap (Did not interfere with activities)  Pain where the shot was given (about 3 in 4 adolescents or 2 in 3 adults)  Redness or swelling where the shot was given (about 1 person in 5)  Mild fever of at least 100.4F (up to about 1 in 25 adolescents or 1 in 100 adults)  Headache (about 3 or 4 people in 10)  Tiredness (about 1 person in 3 or 4)  Nausea, vomiting, diarrhea, stomach ache (up to 1 in 4 adolescents or 1 in 10 adults)  Chills, sore joints (about 1 person in 10)  Body aches (about 1 person in 3 or 4)  Rash, swollen glands (uncommon) Moderate problems following Tdap (Interfered with activities, but did not require medical attention)  Pain where the shot was given (up to 1 in 5 or 6)  Redness or swelling where the shot was given (up to about 1 in 16 adolescents or 1 in 12 adults)  Fever over 102F (about 1 in 100 adolescents or 1 in 250 adults)  Headache (about 1 in 7 adolescents or 1 in 10 adults)  Nausea, vomiting, diarrhea, stomach ache (up to 1 or 3 people in 100)  Swelling of the entire arm where the shot was given (up to about 1 in 500). Severe problems following Tdap (Unable to perform usual activities; required medical attention)  Swelling, severe pain, bleeding and redness in the arm where the shot was given (rare). Problems that could happen after any vaccine:  People sometimes faint after a medical procedure, including vaccination. Sitting or lying down for about 15 minutes can help prevent fainting, and injuries caused by a fall. Tell your doctor if you feel dizzy, or have vision changes or ringing in the ears.  Some people get severe pain in the shoulder and have difficulty moving the arm where a shot was given. This happens very rarely.  Any medication can cause a severe allergic reaction. Such reactions from a vaccine are very rare, estimated at fewer than 1 in a million doses, and would happen within a few minutes to a few hours  after the vaccination. As with any medicine, there is a very remote chance of a vaccine causing a serious injury or death. The safety of vaccines is always being monitored. For more information, visit: www.cdc.gov/vaccinesafety/ 5. What if there is a serious problem? What should I look for?  Look for anything that concerns you, such as signs of a severe allergic reaction, very high fever, or unusual behavior.  Signs of a severe allergic reaction can include hives, swelling of the face and throat, difficulty breathing, a fast heartbeat, dizziness, and weakness. These would usually start a few minutes to a few hours after the vaccination. What should I do?  If you think it is a severe allergic reaction or other emergency that can't wait, call 9-1-1 or get the person to the nearest hospital. Otherwise, call your doctor.  Afterward, the reaction should be reported to the Vaccine Adverse Event Reporting System (VAERS). Your doctor might file this report, or you can do it yourself through the VAERS web site at www.vaers.hhs.gov, or by calling 1-800-822-7967. VAERS does not   give medical advice.  6. The National Vaccine Injury Compensation Program The [employer] Vaccine Injury Compensation Program (VICP) is a federal program that was created to compensate people who may have been injured by certain vaccines. Persons who believe they may have been injured by a vaccine can learn about the program and about filing a claim by calling 1-417-349-3934 or visiting the VICP website at SpiritualWord.atwww.hrsa.gov/vaccinecompensation. There is a time limit to file a claim for compensation. 7. How can I learn more?  Ask your doctor. He or she can give you the vaccine package insert or suggest other sources of information.  Call your local or state health department.  Contact the Centers for Disease Control and Prevention (CDC):  Call (502)648-43741-260-023-4660 (1-800-CDC-INFO) or  Visit CDC's website at PicCapture.uywww.cdc.gov/vaccines CDC Tdap  Vaccine VIS (10/02/13)

## 2016-05-04 NOTE — Progress Notes (Signed)
   PRENATAL VISIT NOTE  Subjective:  Samantha Lamb is a 35 y.o. [id number] at 1793w2d being seen today for ongoing prenatal care.  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  Patient reports no complaints.  Contractions: Not present. Vag. Bleeding: None.  Movement: Present. Denies leaking of fluid.   The following portions of the patient's history were reviewed and updated as appropriate: allergies, current medications, past family history, past medical history, past social history, past surgical history and problem list. Problem list updated.  Objective:   Vitals:   05/04/16 1136  BP: 99/62  Pulse: 100  Weight: 173 lb 6.4 oz (78.7 kg)    Fetal Status: Fetal Heart Rate (bpm): 150 Fundal Height: 27 cm Movement: Present     General:  Alert, oriented and cooperative. Patient is in no acute distress.  Skin: Skin is warm and dry. No rash noted.   Cardiovascular: Normal heart rate noted  Respiratory: Normal respiratory effort, no problems with respiration noted  Abdomen: Soft, gravid, appropriate for gestational age. Pain/Pressure: Present     Pelvic:  Cervical exam deferred        Extremities: Normal range of motion.  Edema: None  Mental Status: Normal mood and affect. Normal behavior. Normal judgment and thought content.   Urinalysis: Urine Protein: 1+ Urine Glucose: Negative CBGs:  All fasting 110s. Normal postprandials/ Assessment and Plan:  Pregnancy: G3P1103 at 6493w2d  1. Gestational diabetes mellitus, antepartum Will start Glyburide at bedtime. Hypoglycemia precautions reviewed.  - glyBURIDE (DIABETA) 2.5 MG tablet; Take 0.5 tablets (1.25 mg  total) by mouth at bedtime.  Dispense: 30 tablet; Refill: 3  2. Supervision of high risk pregnancy in second trimester - Prenatal Multivit-Min-Fe-FA (PRENATAL VITAMINS) 0.8 MG tablet; Take 1 tablet by mouth daily.  Dispense: 30 tablet; Refill: 12  Preterm labor symptoms and general obstetric precautions including but not limited to vaginal bleeding, contractions, leaking of fluid and fetal movement were reviewed in detail with the patient. Please refer to After Visit Summary for other counseling recommendations.  Return in about 2 weeks (around 05/18/2016) for OB Visit, 3rd trimester labs, TDap.  Tereso NewcomerUgonna A Garnell Begeman, MD

## 2016-05-11 ENCOUNTER — Other Ambulatory Visit: Payer: Medicaid Other

## 2016-05-11 ENCOUNTER — Encounter: Payer: Self-pay | Admitting: Obstetrics and Gynecology

## 2016-05-11 DIAGNOSIS — O24415 Gestational diabetes mellitus in pregnancy, controlled by oral hypoglycemic drugs: Secondary | ICD-10-CM

## 2016-05-11 LAB — CBC
HEMATOCRIT: 33.6 % — AB (ref 35.0–45.0)
Hemoglobin: 11.2 g/dL — ABNORMAL LOW (ref 11.7–15.5)
MCH: 27.7 pg (ref 27.0–33.0)
MCHC: 33.3 g/dL (ref 32.0–36.0)
MCV: 83.2 fL (ref 80.0–100.0)
MPV: 9.7 fL (ref 7.5–12.5)
Platelets: 216 10*3/uL (ref 140–400)
RBC: 4.04 MIL/uL (ref 3.80–5.10)
RDW: 14 % (ref 11.0–15.0)
WBC: 11 10*3/uL — ABNORMAL HIGH (ref 3.8–10.8)

## 2016-05-12 LAB — HIV ANTIBODY (ROUTINE TESTING W REFLEX): HIV: NONREACTIVE

## 2016-05-12 LAB — RPR

## 2016-05-14 ENCOUNTER — Encounter: Payer: Self-pay | Admitting: *Deleted

## 2016-05-20 ENCOUNTER — Ambulatory Visit (INDEPENDENT_AMBULATORY_CARE_PROVIDER_SITE_OTHER): Payer: Medicaid Other | Admitting: Family Medicine

## 2016-05-20 VITALS — BP 106/73 | HR 90 | Wt 171.9 lb

## 2016-05-20 DIAGNOSIS — O0993 Supervision of high risk pregnancy, unspecified, third trimester: Secondary | ICD-10-CM

## 2016-05-20 DIAGNOSIS — O09523 Supervision of elderly multigravida, third trimester: Secondary | ICD-10-CM | POA: Diagnosis not present

## 2016-05-20 DIAGNOSIS — Z23 Encounter for immunization: Secondary | ICD-10-CM | POA: Diagnosis not present

## 2016-05-20 DIAGNOSIS — O09213 Supervision of pregnancy with history of pre-term labor, third trimester: Secondary | ICD-10-CM

## 2016-05-20 DIAGNOSIS — O24415 Gestational diabetes mellitus in pregnancy, controlled by oral hypoglycemic drugs: Secondary | ICD-10-CM | POA: Diagnosis not present

## 2016-05-20 DIAGNOSIS — Z8759 Personal history of other complications of pregnancy, childbirth and the puerperium: Secondary | ICD-10-CM | POA: Diagnosis not present

## 2016-05-20 DIAGNOSIS — R7611 Nonspecific reaction to tuberculin skin test without active tuberculosis: Secondary | ICD-10-CM

## 2016-05-20 LAB — POCT URINALYSIS DIP (DEVICE)
Glucose, UA: NEGATIVE mg/dL
Hgb urine dipstick: NEGATIVE
KETONES UR: 40 mg/dL — AB
Leukocytes, UA: NEGATIVE
Nitrite: NEGATIVE
PH: 5.5 (ref 5.0–8.0)
Protein, ur: 30 mg/dL — AB
Specific Gravity, Urine: >=1.03 (ref 1.005–1.030)
Urobilinogen, UA: 1 mg/dL (ref 0.0–1.0)

## 2016-05-20 MED ORDER — TETANUS-DIPHTH-ACELL PERTUSSIS 5-2.5-18.5 LF-MCG/0.5 IM SUSP
0.5000 mL | Freq: Once | INTRAMUSCULAR | Status: AC
  Administered 2016-05-20: 0.5 mL via INTRAMUSCULAR

## 2016-05-20 MED ORDER — GLYBURIDE 2.5 MG PO TABS: 2.5000 mg | ORAL_TABLET | Freq: Every day | ORAL | 3 refills | Status: DC

## 2016-05-20 NOTE — Progress Notes (Signed)
TDap vaccine today

## 2016-05-20 NOTE — Patient Instructions (Signed)
Gestational Diabetes Mellitus  Gestational diabetes mellitus, often simply referred to as gestational diabetes, is a type of diabetes that some women develop during pregnancy. In gestational diabetes, the pancreas does not make enough insulin (a hormone), the cells are less responsive to the insulin that is made (insulin resistance), or both. Normally, insulin moves sugars from food into the tissue cells. The tissue cells use the sugars for energy. The lack of insulin or the lack of normal response to insulin causes excess sugars to build up in the blood instead of going into the tissue cells. As a result, high blood sugar (hyperglycemia) develops. The effect of high sugar (glucose) levels can cause many problems.   RISK FACTORS  You have an increased chance of developing gestational diabetes if you have a family history of diabetes and also have one or more of the following risk factors:  · A body mass index over 30 (obesity).  · A previous pregnancy with gestational diabetes.  · An older age at the time of pregnancy.  If blood glucose levels are kept in the normal range during pregnancy, women can have a healthy pregnancy. If your blood glucose levels are not well controlled, there may be risks to you, your unborn baby (fetus), your labor and delivery, or your newborn baby.   SYMPTOMS   If symptoms are experienced, they are much like symptoms you would normally expect during pregnancy. The symptoms of gestational diabetes include:   · Increased thirst (polydipsia).  · Increased urination (polyuria).  · Increased urination during the night (nocturia).  · Weight loss. This weight loss may be rapid.  · Frequent, recurring infections.  · Tiredness (fatigue).  · Weakness.  · Vision changes, such as blurred vision.  · Fruity smell to your breath.  · Abdominal pain.  DIAGNOSIS  Diabetes is diagnosed when blood glucose levels are increased. Your blood glucose level may be checked by one or more of the following blood  tests:  · A fasting blood glucose test. You will not be allowed to eat for at least 8 hours before a blood sample is taken.  · A random blood glucose test. Your blood glucose is checked at any time of the day regardless of when you ate.  · An oral glucose tolerance test (OGTT). Your blood glucose is measured after you have not eaten (fasted) for 1-3 hours and then after you drink a glucose-containing beverage. Since the hormones that cause insulin resistance are highest at about 24-28 weeks of a pregnancy, an OGTT is usually performed during that time. If you have risk factors, you may be screened for undiagnosed type 2 diabetes at your first prenatal visit.  TREATMENT   Gestational diabetes should be managed first with diet and exercise. Medicines may be added only if they are needed.  · You will need to take diabetes medicine or insulin daily to keep blood glucose levels in the desired range.  · You will need to match insulin dosing with exercise and healthy food choices.  If you have gestational diabetes, your treatment goal is to maintain the following blood glucose levels:  · Before meals (preprandial): at or below 95 mg/dL.  · After meals (postprandial):    One hour after a meal: at or below 140 mg/dL.    Two hours after a meal: at or below 120 mg/dL.  If you have pre-existing type 1 or type 2 diabetes, your treatment goal is to maintain the following blood glucose levels:  · Before   meals, at bedtime, and overnight: 60-99 mg/dL.  · After meals: peak of 100-129 mg/dL.  HOME CARE INSTRUCTIONS   · Have your hemoglobin A1c level checked twice a year.  · Perform daily blood glucose monitoring as directed by your health care provider. It is common to perform frequent blood glucose monitoring.  · Monitor urine ketones when you are ill and as directed by your health care provider.  · Take your diabetes medicine and insulin as directed by your health care provider to maintain your blood glucose level in the desired  range.  ¨ Never run out of diabetes medicine or insulin. It is needed every day.  ¨ Adjust insulin based on your intake of carbohydrates. Carbohydrates can raise blood glucose levels but need to be included in your diet. Carbohydrates provide vitamins, minerals, and fiber which are an essential part of a healthy diet. Carbohydrates are found in fruits, vegetables, whole grains, dairy products, legumes, and foods containing added sugars.  · Eat healthy foods. Alternate 3 meals with 3 snacks.  · Maintain a healthy weight gain. The usual total expected weight gain varies according to your prepregnancy body mass index (BMI).  · Carry a medical alert card or wear your medical alert jewelry.  · Carry a 15-gram carbohydrate snack with you at all times to treat low blood glucose (hypoglycemia). Some examples of 15-gram carbohydrate snacks include:  ¨ Glucose tablets, 3 or 4.  ¨ Glucose gel, 15-gram tube.  ¨ Raisins, 2 tablespoons (24 g).  ¨ Jelly beans, 6.  ¨ Animal crackers, 8.  ¨ Fruit juice, regular soda, or low-fat milk, 4 ounces (120 mL).  ¨ Gummy treats, 9.  · Recognize hypoglycemia. Hypoglycemia during pregnancy occurs with blood glucose levels of 60 mg/dL and below. The risk for hypoglycemia increases when fasting or skipping meals, during or after intense exercise, and during sleep. Hypoglycemia symptoms can include:  ¨ Tremors or shakes.  ¨ Decreased ability to concentrate.  ¨ Sweating.  ¨ Increased heart rate.  ¨ Headache.  ¨ Dry mouth.  ¨ Hunger.  ¨ Irritability.  ¨ Anxiety.  ¨ Restless sleep.  ¨ Altered speech or coordination.  ¨ Confusion.  · Treat hypoglycemia promptly. If you are alert and able to safely swallow, follow the 15:15 rule:  ¨ Take 15-20 grams of rapid-acting glucose or carbohydrate. Rapid-acting options include glucose gel, glucose tablets, or 4 ounces (120 mL) of fruit juice, regular soda, or low-fat milk.  ¨ Check your blood glucose level 15 minutes after taking the glucose.  ¨ Take 15-20  grams more of glucose if the repeat blood glucose level is still 70 mg/dL or below.  ¨ Eat a meal or snack within 1 hour once blood glucose levels return to normal.  · Be alert to polyuria (excess urination) and polydipsia (excess thirst) which are early signs of hyperglycemia. An early awareness of hyperglycemia allows for prompt treatment. Treat hyperglycemia as directed by your health care provider.  · Engage in at least 30 minutes of physical activity a day or as directed by your health care provider. Ten minutes of physical activity timed 30 minutes after each meal is encouraged to control postprandial blood glucose levels.  · Adjust your insulin dosing and food intake as needed if you start a new exercise or sport.  · Follow your sick-day plan at any time you are unable to eat or drink as usual.  · Avoid tobacco and alcohol use.  · Keep all follow-up visits as directed   by your health care provider.  · Follow the advice of your health care provider regarding your prenatal and post-delivery (postpartum) appointments, meal planning, exercise, medicines, vitamins, blood tests, other medical tests, and physical activities.  · Perform daily skin and foot care. Examine your skin and feet daily for cuts, bruises, redness, nail problems, bleeding, blisters, or sores.  · Brush your teeth and gums at least twice a day and floss at least once a day. Follow up with your dentist regularly.  · Schedule an eye exam during the first trimester of your pregnancy or as directed by your health care provider.  · Share your diabetes management plan with your workplace or school.  · Stay up-to-date with immunizations.  · Learn to manage stress.  · Obtain ongoing diabetes education and support as needed.  · Learn about and consider breastfeeding your baby.  · You should have your blood sugar level checked 6-12 weeks after delivery. This is done with an oral glucose tolerance test (OGTT).  SEEK MEDICAL CARE IF:   · You are unable to  eat food or drink fluids for more than 6 hours.  · You have nausea and vomiting for more than 6 hours.  · You have a blood glucose level of 200 mg/dL and you have ketones in your urine.  · There is a change in mental status.  · You develop vision problems.  · You have a persistent headache.  · You have upper abdominal pain or discomfort.  · You develop an additional serious illness.  · You have diarrhea for more than 6 hours.  · You have been sick or have had a fever for a couple of days and are not getting better.  SEEK IMMEDIATE MEDICAL CARE IF:   · You have difficulty breathing.  · You no longer feel the baby moving.  · You are bleeding or have discharge from your vagina.  · You start having premature contractions or labor.  MAKE SURE YOU:  · Understand these instructions.  · Will watch your condition.  · Will get help right away if you are not doing well or get worse.     This information is not intended to replace advice given to you by your health care provider. Make sure you discuss any questions you have with your health care provider.     Document Released: 11/01/2000 Document Revised: 08/16/2014 Document Reviewed: 02/22/2012  Elsevier Interactive Patient Education ©2016 [employer]

## 2016-05-20 NOTE — Progress Notes (Signed)
Subjective:  Samantha Lamb is a 35 y.o. [phone] at 4844w4d being seen today for ongoing prenatal care.  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  Patient reports no complaints.  Contractions: Not present. Vag. Bleeding: None.  Movement: Present. Denies leaking of fluid.   The following portions of the patient's history were reviewed and updated as appropriate: allergies, current medications, past family history, past medical history, past social history, past surgical history and problem list. Problem list updated.  Objective:   Vitals:   05/20/16 1307  BP: 106/73  Pulse: 90  Weight: 171 lb 14.4 oz (78 kg)    Fetal Status: Fetal Heart Rate (bpm): 152 Fundal Height: 28 cm Movement: Present     General:  Alert, oriented and cooperative. Patient is in no acute distress.  Skin: Skin is warm and dry. No rash noted.   Cardiovascular: Normal heart rate noted  Respiratory: Normal respiratory effort, no problems with respiration noted  Abdomen: Soft, gravid, appropriate for gestational age. Pain/Pressure: Present     Pelvic:  Cervical exam deferred        Extremities: Normal range of motion.  Edema: None  Mental Status: Normal mood and affect. Normal behavior. Normal judgment and thought content.   Urinalysis:      Assessment and Plan:  Pregnancy: G3P1103 at 5244w4d  1. Supervision of high risk pregnancy in third trimester - Controlled on Glyburide, taking 2.5mg  QHS, FBS <90s, 2-hr PP <120s.  - glyBURIDE (DIABETA) 2.5 MG tablet; Take 1 tablet (2.5 mg total) by mouth at bedtime.  Dispense: 30 tablet; Refill: 3   2. Elderly multigravida in third  trimester  3. History of prior pregnancy with SGA newborn  4. High risk pregnancy due to history of preterm labor in third trimester - No sign/symptoms  5. Positive TB test  6. Need for Tdap vaccination - Received today - Tdap (BOOSTRIX) injection 0.5 mL; Inject 0.5 mLs into the muscle once.  7. Gestational diabetes mellitus (GDM) controlled on oral hypoglycemic drug, antepartum - Well controlled on 2.5mg  qhs glyburide, BS <90 every morning, <120 2-hr PP - glyBURIDE (DIABETA) 2.5 MG tablet; Take 1 tablet (2.5 mg total) by mouth at bedtime.  Dispense: 30 tablet; Refill: 3 - US Fetal Echocardiography; Future - Ambulatory referral to Ophthalmology  Preterm labor symptoms and general obstetric precautions including but not limited to vaginal bleeding, contractions, leaking of fluid and fetal movement were reviewed in detail with the patient. Please refer to After Visit Summary for other counseling recommendations.  Return in about 3 weeks (around 06/10/2016) for Routine OB; Start NST twice weekly at 32 wks.   [facility] CenterElizabeth Woodland Lake Hiawatha[place]um[place], [place]

## 2016-06-09 ENCOUNTER — Encounter (HOSPITAL_COMMUNITY): Payer: Self-pay

## 2016-06-09 ENCOUNTER — Inpatient Hospital Stay (HOSPITAL_COMMUNITY)
Admission: AD | Admit: 2016-06-09 | Discharge: 2016-06-09 | Disposition: A | Discharge status: Home / Self Care | Payer: Medicaid Other | Source: Ambulatory Visit | Attending: Obstetrics & Gynecology | Admitting: Obstetrics & Gynecology

## 2016-06-09 DIAGNOSIS — R1030 Lower abdominal pain, unspecified: Secondary | ICD-10-CM | POA: Insufficient documentation

## 2016-06-09 DIAGNOSIS — E860 Dehydration: Secondary | ICD-10-CM

## 2016-06-09 DIAGNOSIS — N898 Other specified noninflammatory disorders of vagina: Secondary | ICD-10-CM

## 2016-06-09 DIAGNOSIS — O09523 Supervision of elderly multigravida, third trimester: Secondary | ICD-10-CM

## 2016-06-09 DIAGNOSIS — R109 Unspecified abdominal pain: Secondary | ICD-10-CM | POA: Diagnosis present

## 2016-06-09 DIAGNOSIS — O26893 Other specified pregnancy related conditions, third trimester: Secondary | ICD-10-CM | POA: Insufficient documentation

## 2016-06-09 DIAGNOSIS — Z3A32 32 weeks gestation of pregnancy: Secondary | ICD-10-CM | POA: Insufficient documentation

## 2016-06-09 LAB — WET PREP, GENITAL
CLUE CELLS WET PREP: NONE SEEN
Sperm: NONE SEEN
TRICH WET PREP: NONE SEEN
YEAST WET PREP: NONE SEEN

## 2016-06-09 LAB — URINALYSIS, ROUTINE W REFLEX MICROSCOPIC
Bilirubin Urine: NEGATIVE
Glucose, UA: NEGATIVE mg/dL
Hgb urine dipstick: NEGATIVE
KETONES UR: 15 mg/dL — AB
LEUKOCYTES UA: NEGATIVE
Nitrite: NEGATIVE
PROTEIN: NEGATIVE mg/dL
Specific Gravity, Urine: 1.025 (ref 1.005–1.030)
pH: 6 (ref 5.0–8.0)

## 2016-06-09 NOTE — MAU Provider Note (Signed)
MAU HISTORY AND PHYSICAL  Chief Complaint:  Abdominal Pain   Samantha Lamb is a 35 y.o.  [id number]  at 1w3dpresenting for Abdominal Pain . Patient states she has been having  irregular, every 10-15 minutes contractions, none vaginal bleeding, intact membranes, with active fetal movement.    Reports feeling occasional contractions since last night, these have gotten more frequent today. Also endorses some lower abdominal pain. Last intercourse 1 week ago. Noticed some discharge that had a different odor today.   Past Medical History:  Diagnosis Date  . Hyperlipemia   . Medical history non-contributory   . Pregnant     Past Surgical History:  Procedure Laterality Date  . NO PAST SURGERIES      Family History  Problem Relation Age of Onset  . Diabetes Mother   . Diabetes Father   . Heart failure Father     Social History  Substance Use Topics  . Smoking status: Never Smoker  . Smokeless tobacco: Never Used  . Alcohol use No    Allergies  Allergen Reactions  . Penicillins Hives and Itching    Has patient had a PCN reaction causing immediate rash, facial/tongue/throat swelling, SOB or lightheadedness with hypotension: yes Has patient had a PCN reaction causing severe rash involving mucus membranes or skin necrosis: no Has patient had a PCN reaction that required hospitalization yes Has patient had a PCN reaction occurring within the last 10 years: no If all of the above answers are "NO", then may proceed with Cephalosporin use.     Prescriptions Prior to Admission  Medication Sig Dispense Refill Last Dose  . aspirin EC 81 MG tablet Take 1 tablet (81 mg total) by mouth daily. 30 tablet 6 Past Week at Unknown time  . glyBURIDE (DIABETA) 2.5 MG tablet Take 1 tablet (2.5 mg total) by mouth at bedtime. 30 tablet 3 06/08/2016 at Unknown time  . Prenatal Multivit-Min-Fe-FA (PRENATAL VITAMINS) 0.8 MG tablet Take 1 tablet by mouth daily. 30 tablet 12 06/08/2016 at Unknown time   . ACCU-CHEK FASTCLIX LANCETS MISC 1 Units by Percutaneous route 4 (four) times daily. 100 each 12 Taking  . Blood Glucose Monitoring Suppl (ACCU-CHEK AVIVA PLUS) w/Device KIT Check blood sugars 4 times a day  250.00 1 kit 1 Taking  . glucose blood test strip Use as instructed 100 each 12 Taking  . Lancet Devices (ACCU-CHEK SOFTCLIX) lancets Use as instructed 30 each 11 Taking    Review of Systems - Negative except for what is mentioned in HPI.  Physical Exam  Blood pressure 96/63, pulse 118, temperature 98.2 F (36.8 C), temperature source Oral, resp. rate 18, height _0  (1.6 m), weight 78.4 kg (172 lb 12 oz), SpO2 96 %. GENERAL: Well-developed, well-nourished female in no acute distress.  LUNGS: No respiratory distress HEART: Regular rate ABDOMEN: Soft, nontender, nondistended, gravid abdomen.  EXTREMITIES: Nontender, no edema, 2+ distal pulses. GU: mucous present, cervix closed Presentation: cephalic FHT:  Baseline 1557 moderate variability, accelerations present, no decelerations Contractions: irritable   Labs: Results for orders placed or performed during the hospital encounter of 06/09/16 (from the past 24 hour(s))  Urinalysis, [facility] w reflex microscopic [facility]   Collection Time: 06/09/16 10:19 AM  Result Value Ref Range   Color, Urine YELLOW YELLOW   APPearance CLEAR CLEAR   Specific Gravity, Urine 1.025 1.005 - 1.030   pH 6.0 5.0 - 8.0   Glucose, UA NEGATIVE NEGATIVE mg/dL   Hgb urine dipstick NEGATIVE NEGATIVE  Bilirubin Urine NEGATIVE NEGATIVE   Ketones, ur 15 (A) NEGATIVE mg/dL   Protein, ur NEGATIVE NEGATIVE mg/dL   Nitrite NEGATIVE NEGATIVE   Leukocytes, UA NEGATIVE NEGATIVE  Wet prep, genital   Collection Time: 06/09/16 11:10 AM  Result Value Ref Range   Yeast Wet Prep HPF POC NONE SEEN NONE SEEN   Trich, Wet Prep NONE SEEN NONE SEEN   Clue Cells Wet Prep HPF POC NONE SEEN NONE SEEN   WBC, Wet Prep HPF POC FEW (A) NONE SEEN   Sperm NONE SEEN      Imaging Studies:  No results found.  Assessment: Samantha Lamb is  35 y.o. [phone] at 2w3dpresents with Abdominal Pain . MDM U/A Wet prep  GC/Chlamydia  Plan:  Pre-term contractions- Likely due to dehydration- encouraged increased PO water intake  Wet prep unremarkable Category 1 tracing Reassurance provided Return precautions given Patient verbalized understanding and agreement with plan   ASteve Rattler DO PGY-1 11/1/201711:49 AM  OB FELLOW DISCHARGE ATTESTATION  I have seen and examined this patient and agree with above documentation in the resident's note.   NJacquiline Doe MD 1:53 PM

## 2016-06-09 NOTE — MAU Note (Signed)
Pt states she started having some contractions last night and they went away this morning. Pt states she is now having constant lower abdominal pain that is not going away. Pt denies bleeding and leaking of fluid. Pt states baby is moving normally. Pt states she has noticed some vaginal discharge that seems different than normal and has a different smell.

## 2016-06-09 NOTE — Discharge Instructions (Signed)

## 2016-06-10 ENCOUNTER — Other Ambulatory Visit: Payer: Medicaid Other | Admitting: Family Medicine

## 2016-06-10 LAB — GC/CHLAMYDIA PROBE AMP (~~LOC~~) NOT AT ARMC
CHLAMYDIA, DNA PROBE: NEGATIVE
Neisseria Gonorrhea: NEGATIVE

## 2016-06-10 IMAGING — CR DG CHEST 2V
2 series · 2 of 2 positions shown · non-contrast
Comparison: None.

CLINICAL DATA: DIZZINESS CHEST PAIN BACK PAIN

EXAM:
CHEST  2 VIEW

[w chest pa]
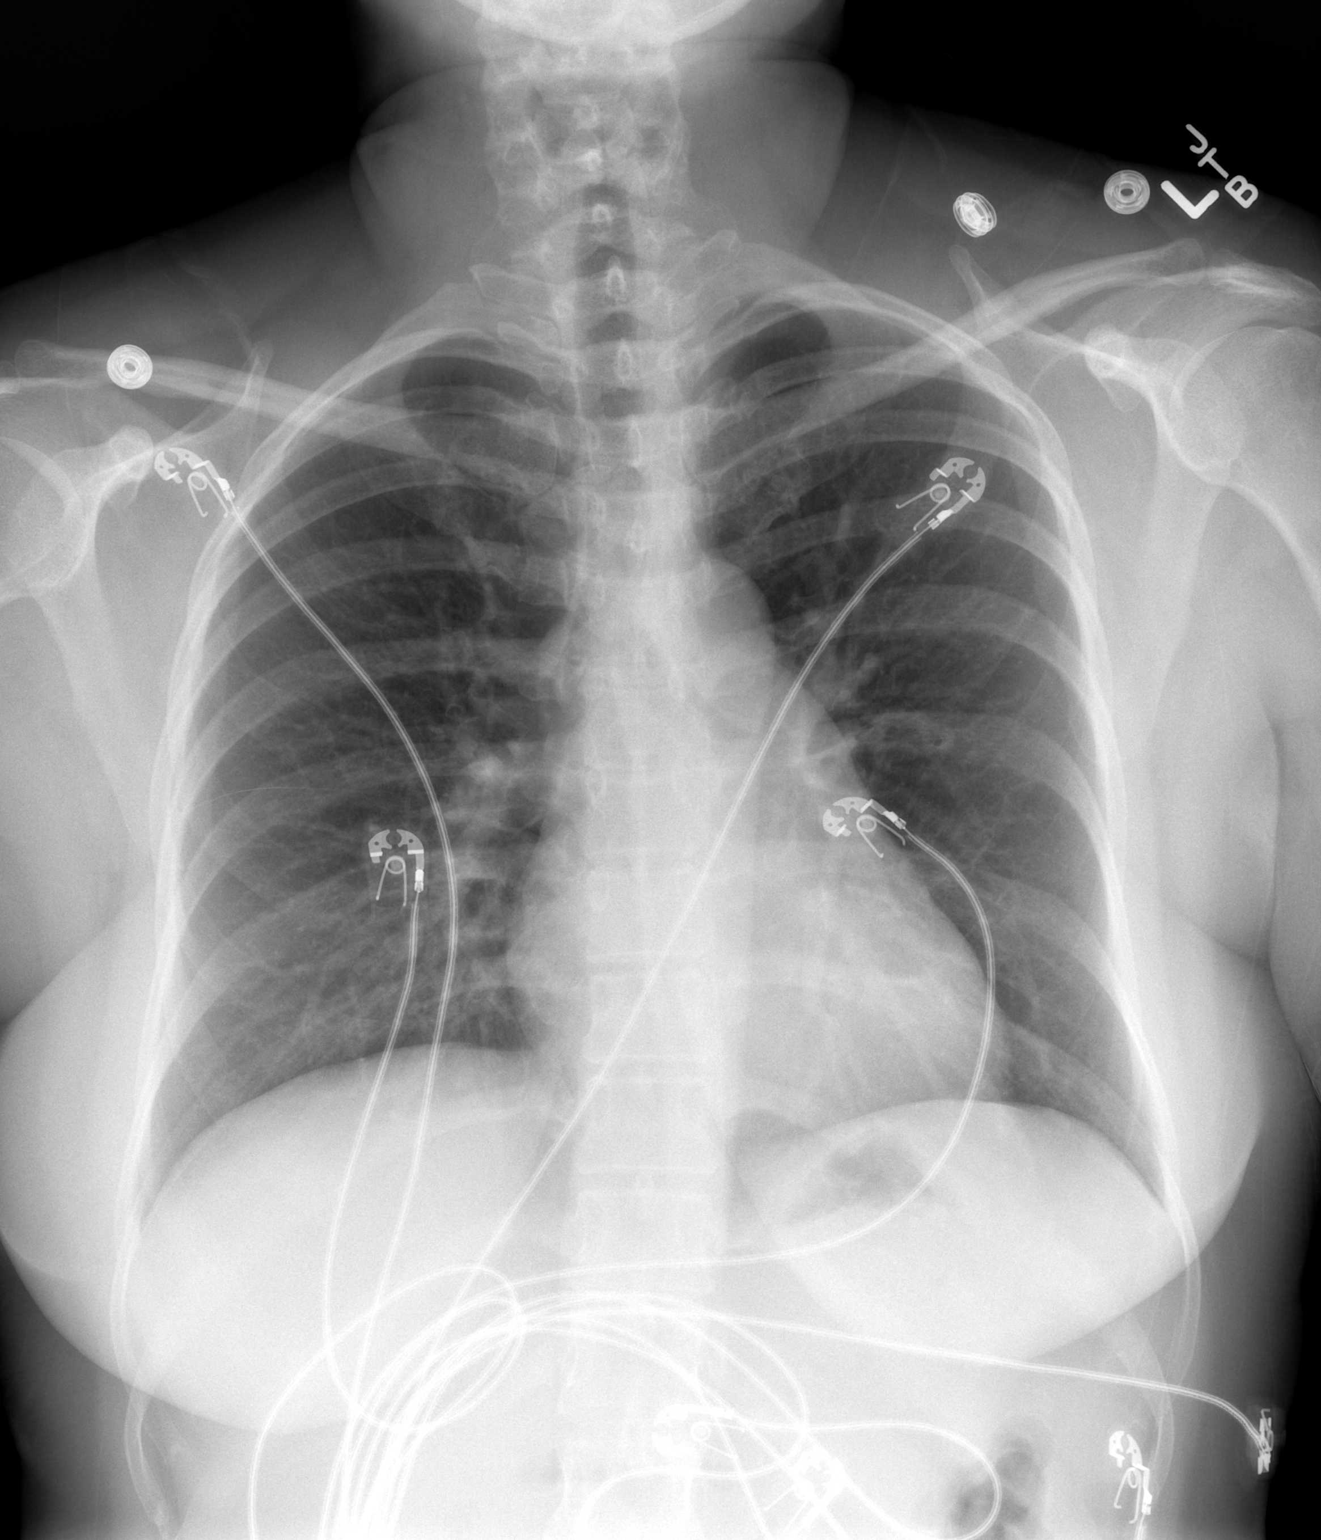

[w chest lat]
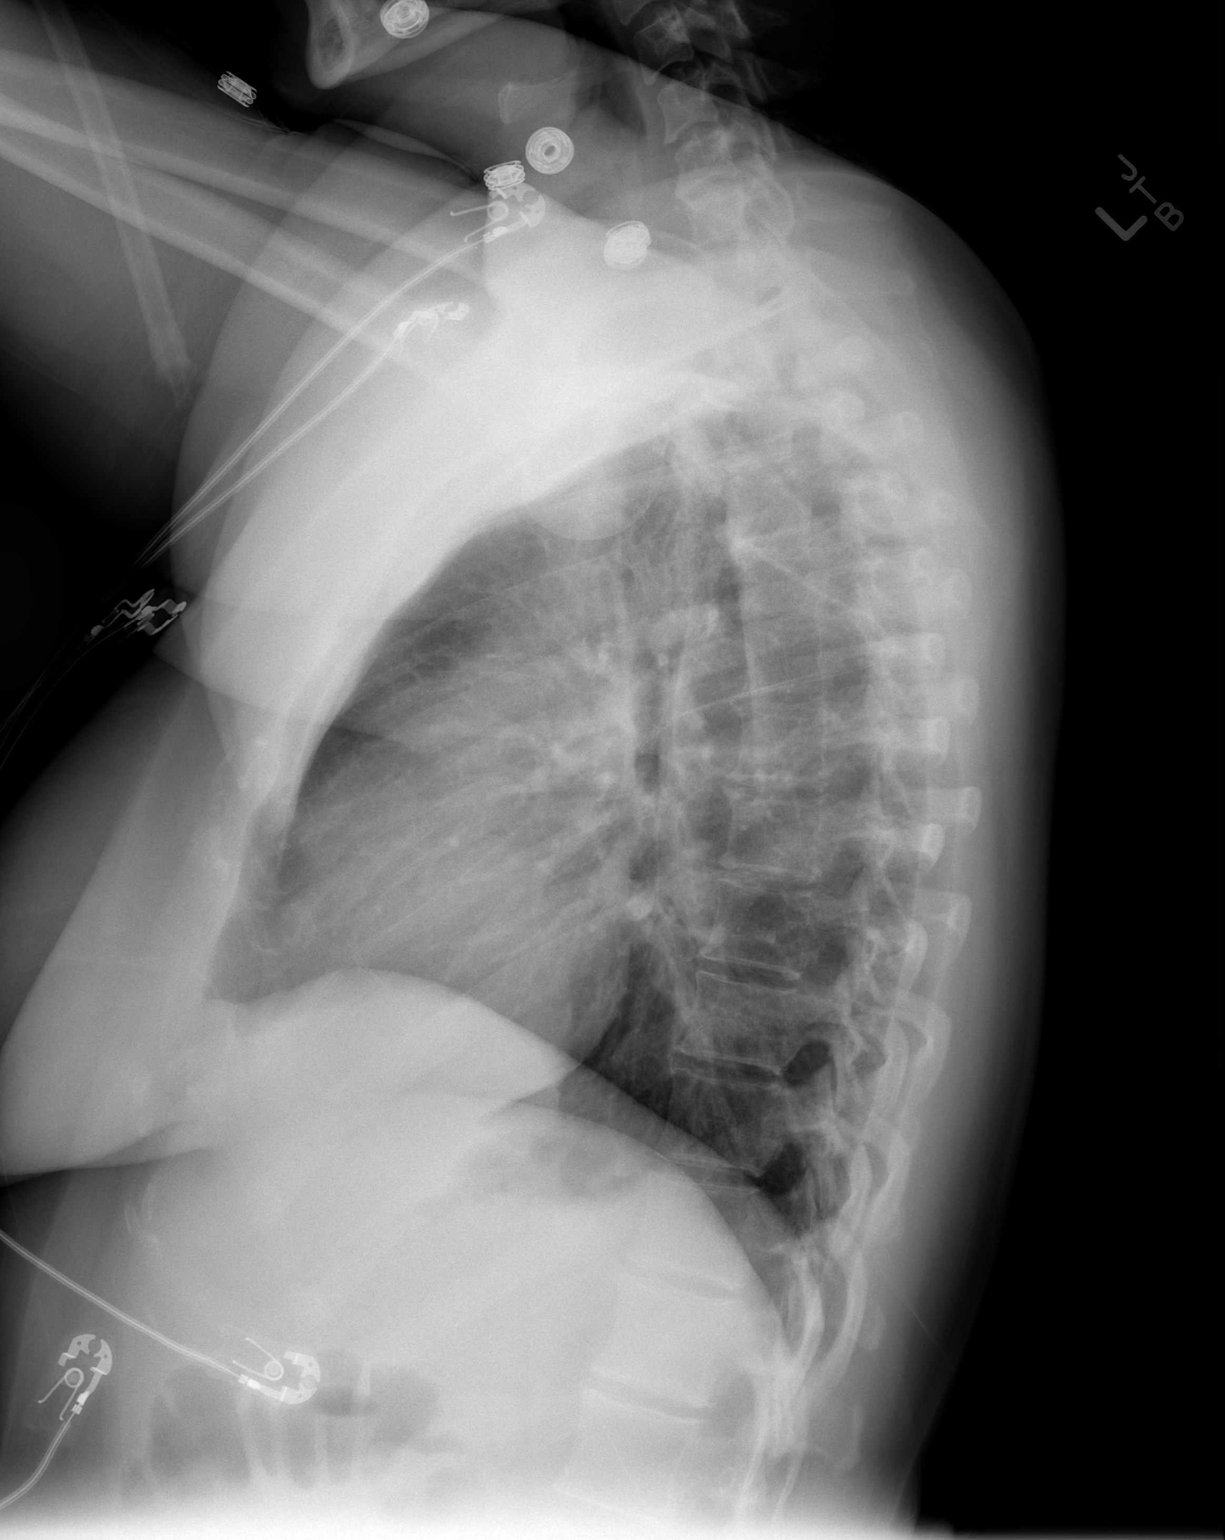

[2 of 2 positions shown; findings below may reference images not displayed]

FINDINGS: The heart size and mediastinal contours are within normal limits.
Both lungs are clear. The visualized skeletal structures are
unremarkable.
IMPRESSION: No active cardiopulmonary disease.

## 2016-06-15 ENCOUNTER — Ambulatory Visit: Payer: Self-pay

## 2016-06-15 ENCOUNTER — Ambulatory Visit (INDEPENDENT_AMBULATORY_CARE_PROVIDER_SITE_OTHER): Payer: Medicaid Other | Admitting: Advanced Practice Midwife

## 2016-06-15 VITALS — BP 97/58 | HR 91 | Wt 175.0 lb

## 2016-06-15 DIAGNOSIS — O24415 Gestational diabetes mellitus in pregnancy, controlled by oral hypoglycemic drugs: Secondary | ICD-10-CM | POA: Diagnosis present

## 2016-06-15 DIAGNOSIS — O0993 Supervision of high risk pregnancy, unspecified, third trimester: Secondary | ICD-10-CM

## 2016-06-15 LAB — POCT URINALYSIS DIP (DEVICE)
BILIRUBIN URINE: NEGATIVE
Glucose, UA: NEGATIVE mg/dL
HGB URINE DIPSTICK: NEGATIVE
Ketones, ur: 15 mg/dL — AB
LEUKOCYTES UA: NEGATIVE
Nitrite: NEGATIVE
Protein, ur: NEGATIVE mg/dL
SPECIFIC GRAVITY, URINE: 1.025 (ref 1.005–1.030)
UROBILINOGEN UA: 0.2 mg/dL (ref 0.0–1.0)
pH: 5.5 (ref 5.0–8.0)

## 2016-06-15 NOTE — Progress Notes (Signed)
Pt scheduled [facility] on 11/20 @ 0930.

## 2016-06-15 NOTE — Progress Notes (Signed)
   PRENATAL VISIT NOTE  Subjective:  Samantha Lamb is a 35 y.o. [phone] at 6170w2d being seen today for ongoing prenatal care.  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  Patient reports no complaints and Did not bring log book    States fastings are all under 90 and PCs are all under 120 (105-110).  States are good as long as she takes her med.  Contractions: Not present.  .  Movement: Present. Denies leaking of fluid.   The following portions of the patient's history were reviewed and updated as appropriate: allergies, current medications, past family history, past medical history, past social history, past surgical history and problem list. Problem list updated.  Objective:   Vitals:   06/15/16 0931  BP: (!) 97/58  Pulse: 91  Weight: 175 lb (79.4 kg)    Fetal Status: Fetal Heart Rate (bpm): 135   Movement: Present     General:  Alert, oriented and cooperative. Patient is in no acute distress.  Skin: Skin is warm and dry. No rash noted.   Cardiovascular: Normal heart rate noted  Respiratory: Normal respiratory effort, no problems with respiration noted  Abdomen: Soft, gravid, appropriate for gestational age. Pain/Pressure: Present     Pelvic:  Cervical exam deferred        Extremities: Normal range of motion.  Edema: None  Mental Status: Normal mood and affect. Normal behavior. Normal judgment and thought content.   Assessment and Plan:  Pregnancy: G3P1103 at 2670w2d  1. Supervision of high risk pregnancy in third trimester   2. Gestational diabetes mellitus (GDM) controlled on oral hypoglycemic drug, antepartum       Had not been scheduled  for twice weekly testing       Started today, NST reactive        Further schedule (including Fetal  Echo) by D Day RN - US MFM OB COMP + 14 WK; Future - US OB Limited - Fetal nonstress test  Preterm labor symptoms and general obstetric precautions including but not limited to vaginal bleeding, contractions, leaking of fluid and fetal movement were reviewed in detail with the patient. Please refer to After Visit Summary for other counseling recommendations.  Return in about 2 days (around 06/17/2016) for NST only;  11/13  Ob fu and NST , [facility].   Aviva SignsMarie L Keona Bilyeu, CNM

## 2016-06-15 NOTE — Patient Instructions (Signed)

## 2016-06-17 ENCOUNTER — Ambulatory Visit (INDEPENDENT_AMBULATORY_CARE_PROVIDER_SITE_OTHER): Payer: Medicaid Other | Admitting: *Deleted

## 2016-06-17 VITALS — BP 111/65 | HR 116

## 2016-06-17 DIAGNOSIS — O24415 Gestational diabetes mellitus in pregnancy, controlled by oral hypoglycemic drugs: Secondary | ICD-10-CM | POA: Diagnosis not present

## 2016-06-20 ENCOUNTER — Inpatient Hospital Stay (HOSPITAL_COMMUNITY)
Admission: AD | Admit: 2016-06-20 | Discharge: 2016-06-20 | Disposition: A | Discharge status: Home / Self Care | Payer: Medicaid Other | Source: Ambulatory Visit | Attending: Obstetrics & Gynecology | Admitting: Obstetrics & Gynecology

## 2016-06-20 ENCOUNTER — Encounter (HOSPITAL_COMMUNITY): Payer: Self-pay

## 2016-06-20 DIAGNOSIS — W19XXXA Unspecified fall, initial encounter: Secondary | ICD-10-CM | POA: Diagnosis not present

## 2016-06-20 DIAGNOSIS — Y92009 Unspecified place in unspecified non-institutional (private) residence as the place of occurrence of the external cause: Secondary | ICD-10-CM

## 2016-06-20 DIAGNOSIS — R109 Unspecified abdominal pain: Secondary | ICD-10-CM | POA: Diagnosis not present

## 2016-06-20 DIAGNOSIS — Z3490 Encounter for supervision of normal pregnancy, unspecified, unspecified trimester: Secondary | ICD-10-CM | POA: Insufficient documentation

## 2016-06-20 DIAGNOSIS — Z331 Pregnant state, incidental: Secondary | ICD-10-CM

## 2016-06-20 LAB — URINALYSIS, ROUTINE W REFLEX MICROSCOPIC
GLUCOSE, UA: NEGATIVE mg/dL
Hgb urine dipstick: NEGATIVE
KETONES UR: 40 mg/dL — AB
LEUKOCYTES UA: NEGATIVE
NITRITE: NEGATIVE
PROTEIN: NEGATIVE mg/dL
Specific Gravity, Urine: >1.03 — ABNORMAL HIGH (ref 1.005–1.030)
pH: 5.5 (ref 5.0–8.0)

## 2016-06-20 LAB — GLUCOSE, CAPILLARY: GLUCOSE-CAPILLARY: 83 mg/dL (ref 65–99)

## 2016-06-20 LAB — AMNISURE RUPTURE OF MEMBRANE (ROM) NOT AT ARMC: AMNISURE: NEGATIVE

## 2016-06-20 NOTE — MAU Note (Addendum)
Pt said her kids were running around and she fell back and landed on her bottom around 3pm. Her pain is in her lower abdomen. She felt like she had a small leak of fluid, no bleeding

## 2016-06-20 NOTE — MAU Note (Addendum)
Discharged Pt per Zerita Boersarlene Lawson CNM. Advised patient to use heat or ice for the injury and that she can take tylenol for pain.  Also gave precautions and advised when to come back to MAU (bleeding, leaking of fluid, strong close contractions).

## 2016-06-20 NOTE — MAU Note (Signed)
Pt assisted back from bathroom. Resumed monitoring.

## 2016-06-20 NOTE — MAU Provider Note (Signed)
History   G3P1103 @ 34 wks who was tripped up by her children and fell and hit on her butt. Denies vag bleeding. States had sm gush of fluid and thinks when she hit she lost urine.  CSN: [id number]  Arrival date & time 06/20/16  1545   None     Chief Complaint  Patient presents with  . Fall    HPI  Past Medical History:  Diagnosis Date  . Hyperlipemia   . Medical history non-contributory   . Pregnant     Past Surgical History:  Procedure Laterality Date  . NO PAST SURGERIES      Family History  Problem Relation Age of Onset  . Diabetes Mother   . Diabetes Father   . Heart failure Father     Social History  Substance Use Topics  . Smoking status: Never Smoker  . Smokeless tobacco: Never Used  . Alcohol use No    OB History    Gravida Para Term Preterm AB Living   3 2 1 1  0 3   SAB TAB Ectopic Multiple Live Births   0 0 0 1 3      Review of Systems  Constitutional: Negative.   HENT: Negative.   Eyes: Negative.   Respiratory: Negative.   Cardiovascular: Negative.   Gastrointestinal: Positive for abdominal pain.  Endocrine: Negative.   Genitourinary: Negative.   Musculoskeletal: Negative.   Skin: Negative.   Allergic/Immunologic: Negative.   Neurological: Negative.   Hematological: Negative.   Psychiatric/Behavioral: Negative.     Allergies  Penicillins  Home Medications    BP 105/68   Pulse 100   Temp 98.4 F (36.9 C)   Resp 16   Physical Exam  Constitutional: She is oriented to person, place, and time. She appears well-developed and well-nourished.  HENT:  Head: Normocephalic.  Eyes: Pupils are equal, round, and reactive to light.  Neck: Normal range of motion.  Cardiovascular: Normal rate, regular rhythm, normal heart sounds and intact distal pulses.   Pulmonary/Chest: Effort normal and breath sounds normal.  Abdominal: Soft. Bowel sounds are normal.  Genitourinary: Vagina normal and uterus normal.  Musculoskeletal: Normal range  of motion.  Neurological: She is alert and oriented to person, place, and time. She has normal reflexes.  Skin: Skin is warm and dry.  Psychiatric: She has a normal mood and affect. Her behavior is normal. Judgment and thought content normal.    MAU Course  Procedures (including critical care time)  Labs Reviewed  URINALYSIS, ROUTINE W REFLEX MICROSCOPIC (NOT AT [facility]) - Abnormal; Notable for the following:       Result Value   Specific Gravity, Urine >1.030 (*)    Bilirubin Urine SMALL (*)    Ketones, ur 40 (*)    All other components within normal limits  GLUCOSE, CAPILLARY  AMNISURE RUPTURE OF MEMBRANE (ROM) NOT AT [facility]   No results found.   1. Fall at home, initial encounter       MDM  Fall at 34 wks. amnisure neg. Mild contractions that pt is unaware of. FHR pattern reassuring.  Plan: will monitor x 4 hrs if remain stable will d/c home. Reviewed fetal kick counts and PTL symptoms.

## 2016-06-21 ENCOUNTER — Ambulatory Visit: Payer: Self-pay

## 2016-06-21 ENCOUNTER — Ambulatory Visit (HOSPITAL_COMMUNITY): Payer: Medicaid Other

## 2016-06-21 ENCOUNTER — Ambulatory Visit (INDEPENDENT_AMBULATORY_CARE_PROVIDER_SITE_OTHER): Payer: Medicaid Other | Admitting: Family Medicine

## 2016-06-21 VITALS — BP 99/61 | HR 106 | Wt 176.9 lb

## 2016-06-21 DIAGNOSIS — O09523 Supervision of elderly multigravida, third trimester: Secondary | ICD-10-CM | POA: Diagnosis not present

## 2016-06-21 DIAGNOSIS — O0993 Supervision of high risk pregnancy, unspecified, third trimester: Secondary | ICD-10-CM

## 2016-06-21 DIAGNOSIS — O09213 Supervision of pregnancy with history of pre-term labor, third trimester: Secondary | ICD-10-CM | POA: Diagnosis not present

## 2016-06-21 DIAGNOSIS — Z3689 Encounter for other specified antenatal screening: Secondary | ICD-10-CM | POA: Diagnosis not present

## 2016-06-21 DIAGNOSIS — O24415 Gestational diabetes mellitus in pregnancy, controlled by oral hypoglycemic drugs: Secondary | ICD-10-CM | POA: Diagnosis not present

## 2016-06-21 DIAGNOSIS — R7611 Nonspecific reaction to tuberculin skin test without active tuberculosis: Secondary | ICD-10-CM

## 2016-06-21 DIAGNOSIS — O34219 Maternal care for unspecified type scar from previous cesarean delivery: Secondary | ICD-10-CM | POA: Diagnosis not present

## 2016-06-21 DIAGNOSIS — Z8759 Personal history of other complications of pregnancy, childbirth and the puerperium: Secondary | ICD-10-CM

## 2016-06-21 NOTE — Patient Instructions (Signed)
Gestational Diabetes Mellitus  Gestational diabetes mellitus, often simply referred to as gestational diabetes, is a type of diabetes that some women develop during pregnancy. In gestational diabetes, the pancreas does not make enough insulin (a hormone), the cells are less responsive to the insulin that is made (insulin resistance), or both. Normally, insulin moves sugars from food into the tissue cells. The tissue cells use the sugars for energy. The lack of insulin or the lack of normal response to insulin causes excess sugars to build up in the blood instead of going into the tissue cells. As a result, high blood sugar (hyperglycemia) develops. The effect of high sugar (glucose) levels can cause many problems.   RISK FACTORS  You have an increased chance of developing gestational diabetes if you have a family history of diabetes and also have one or more of the following risk factors:  · A body mass index over 30 (obesity).  · A previous pregnancy with gestational diabetes.  · An older age at the time of pregnancy.  If blood glucose levels are kept in the normal range during pregnancy, women can have a healthy pregnancy. If your blood glucose levels are not well controlled, there may be risks to you, your unborn baby (fetus), your labor and delivery, or your newborn baby.   SYMPTOMS   If symptoms are experienced, they are much like symptoms you would normally expect during pregnancy. The symptoms of gestational diabetes include:   · Increased thirst (polydipsia).  · Increased urination (polyuria).  · Increased urination during the night (nocturia).  · Weight loss. This weight loss may be rapid.  · Frequent, recurring infections.  · Tiredness (fatigue).  · Weakness.  · Vision changes, such as blurred vision.  · Fruity smell to your breath.  · Abdominal pain.  DIAGNOSIS  Diabetes is diagnosed when blood glucose levels are increased. Your blood glucose level may be checked by one or more of the following blood  tests:  · A fasting blood glucose test. You will not be allowed to eat for at least 8 hours before a blood sample is taken.  · A random blood glucose test. Your blood glucose is checked at any time of the day regardless of when you ate.  · An oral glucose tolerance test (OGTT). Your blood glucose is measured after you have not eaten (fasted) for 1-3 hours and then after you drink a glucose-containing beverage. Since the hormones that cause insulin resistance are highest at about 24-28 weeks of a pregnancy, an OGTT is usually performed during that time. If you have risk factors, you may be screened for undiagnosed type 2 diabetes at your first prenatal visit.  TREATMENT   Gestational diabetes should be managed first with diet and exercise. Medicines may be added only if they are needed.  · You will need to take diabetes medicine or insulin daily to keep blood glucose levels in the desired range.  · You will need to match insulin dosing with exercise and healthy food choices.  If you have gestational diabetes, your treatment goal is to maintain the following blood glucose levels:  · Before meals (preprandial): at or below 95 mg/dL.  · After meals (postprandial):    One hour after a meal: at or below 140 mg/dL.    Two hours after a meal: at or below 120 mg/dL.  If you have pre-existing type 1 or type 2 diabetes, your treatment goal is to maintain the following blood glucose levels:  · Before   meals, at bedtime, and overnight: 60-99 mg/dL.  · After meals: peak of 100-129 mg/dL.  HOME CARE INSTRUCTIONS   · Have your hemoglobin A1c level checked twice a year.  · Perform daily blood glucose monitoring as directed by your health care provider. It is common to perform frequent blood glucose monitoring.  · Monitor urine ketones when you are ill and as directed by your health care provider.  · Take your diabetes medicine and insulin as directed by your health care provider to maintain your blood glucose level in the desired  range.  ¨ Never run out of diabetes medicine or insulin. It is needed every day.  ¨ Adjust insulin based on your intake of carbohydrates. Carbohydrates can raise blood glucose levels but need to be included in your diet. Carbohydrates provide vitamins, minerals, and fiber which are an essential part of a healthy diet. Carbohydrates are found in fruits, vegetables, whole grains, dairy products, legumes, and foods containing added sugars.  · Eat healthy foods. Alternate 3 meals with 3 snacks.  · Maintain a healthy weight gain. The usual total expected weight gain varies according to your prepregnancy body mass index (BMI).  · Carry a medical alert card or wear your medical alert jewelry.  · Carry a 15-gram carbohydrate snack with you at all times to treat low blood glucose (hypoglycemia). Some examples of 15-gram carbohydrate snacks include:  ¨ Glucose tablets, 3 or 4.  ¨ Glucose gel, 15-gram tube.  ¨ Raisins, 2 tablespoons (24 g).  ¨ Jelly beans, 6.  ¨ Animal crackers, 8.  ¨ Fruit juice, regular soda, or low-fat milk, 4 ounces (120 mL).  ¨ Gummy treats, 9.  · Recognize hypoglycemia. Hypoglycemia during pregnancy occurs with blood glucose levels of 60 mg/dL and below. The risk for hypoglycemia increases when fasting or skipping meals, during or after intense exercise, and during sleep. Hypoglycemia symptoms can include:  ¨ Tremors or shakes.  ¨ Decreased ability to concentrate.  ¨ Sweating.  ¨ Increased heart rate.  ¨ Headache.  ¨ Dry mouth.  ¨ Hunger.  ¨ Irritability.  ¨ Anxiety.  ¨ Restless sleep.  ¨ Altered speech or coordination.  ¨ Confusion.  · Treat hypoglycemia promptly. If you are alert and able to safely swallow, follow the 15:15 rule:  ¨ Take 15-20 grams of rapid-acting glucose or carbohydrate. Rapid-acting options include glucose gel, glucose tablets, or 4 ounces (120 mL) of fruit juice, regular soda, or low-fat milk.  ¨ Check your blood glucose level 15 minutes after taking the glucose.  ¨ Take 15-20  grams more of glucose if the repeat blood glucose level is still 70 mg/dL or below.  ¨ Eat a meal or snack within 1 hour once blood glucose levels return to normal.  · Be alert to polyuria (excess urination) and polydipsia (excess thirst) which are early signs of hyperglycemia. An early awareness of hyperglycemia allows for prompt treatment. Treat hyperglycemia as directed by your health care provider.  · Engage in at least 30 minutes of physical activity a day or as directed by your health care provider. Ten minutes of physical activity timed 30 minutes after each meal is encouraged to control postprandial blood glucose levels.  · Adjust your insulin dosing and food intake as needed if you start a new exercise or sport.  · Follow your sick-day plan at any time you are unable to eat or drink as usual.  · Avoid tobacco and alcohol use.  · Keep all follow-up visits as directed   by your health care provider.  · Follow the advice of your health care provider regarding your prenatal and post-delivery (postpartum) appointments, meal planning, exercise, medicines, vitamins, blood tests, other medical tests, and physical activities.  · Perform daily skin and foot care. Examine your skin and feet daily for cuts, bruises, redness, nail problems, bleeding, blisters, or sores.  · Brush your teeth and gums at least twice a day and floss at least once a day. Follow up with your dentist regularly.  · Schedule an eye exam during the first trimester of your pregnancy or as directed by your health care provider.  · Share your diabetes management plan with your workplace or school.  · Stay up-to-date with immunizations.  · Learn to manage stress.  · Obtain ongoing diabetes education and support as needed.  · Learn about and consider breastfeeding your baby.  · You should have your blood sugar level checked 6-12 weeks after delivery. This is done with an oral glucose tolerance test (OGTT).  SEEK MEDICAL CARE IF:   · You are unable to  eat food or drink fluids for more than 6 hours.  · You have nausea and vomiting for more than 6 hours.  · You have a blood glucose level of 200 mg/dL and you have ketones in your urine.  · There is a change in mental status.  · You develop vision problems.  · You have a persistent headache.  · You have upper abdominal pain or discomfort.  · You develop an additional serious illness.  · You have diarrhea for more than 6 hours.  · You have been sick or have had a fever for a couple of days and are not getting better.  SEEK IMMEDIATE MEDICAL CARE IF:   · You have difficulty breathing.  · You no longer feel the baby moving.  · You are bleeding or have discharge from your vagina.  · You start having premature contractions or labor.  MAKE SURE YOU:  · Understand these instructions.  · Will watch your condition.  · Will get help right away if you are not doing well or get worse.     This information is not intended to replace advice given to you by your health care provider. Make sure you discuss any questions you have with your health care provider.     Document Released: 11/01/2000 Document Revised: 08/16/2014 Document Reviewed: 02/22/2012  Elsevier Interactive Patient Education ©2016 [employer]

## 2016-06-21 NOTE — Progress Notes (Signed)
Pt states she was told to stop taking ASA 81 mg @ 33 wks.

## 2016-06-21 NOTE — Progress Notes (Signed)
   Subjective:  Samantha Lamb is a 35 y.o. [phone] at 81103w1d being seen today for ongoing prenatal care.  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  Patient reports no complaints.  Contractions: Irregular. Vag. Bleeding: None.  Movement: Present. Denies leaking of fluid.   The following portions of the patient's history were reviewed and updated as appropriate: allergies, current medications, past family history, past medical history, past social history, past surgical history and problem list. Problem list updated.  Objective:   Vitals:   06/21/16 1606  BP: 99/61  Pulse: (!) 106    Fetal Status: Fetal Heart Rate (bpm): NST   Movement: Present  Presentation: Vertex FH: 34cm  General:  Alert, oriented and cooperative. Patient is in no acute distress.  Skin: Skin is warm and dry. No rash noted.   Cardiovascular: Normal heart rate noted  Respiratory: Normal respiratory effort, no problems with respiration noted  Abdomen: Soft, gravid, appropriate for gestational age. Pain/Pressure: Present     Pelvic:  Cervical exam deferred        Extremities: Normal range of motion.     Mental Status: Normal mood and affect. Normal behavior. Normal judgment and thought content.   Urinalysis:      Assessment and Plan:  Pregnancy: G3P1103 at 47103w1d  1. Supervision of high risk pregnancy in third trimester - GBS next visit  2. Gestational diabetes mellitus (GDM) controlled on oral hypoglycemic drug, antepartum  GDMA2/B - Diagnosed as GDMA2/B at 22 weeks (failed 3 hr GTT), controlled on glyburide. - Reviewed glucometer, only has daily FBS (majority normal, 1 or 2 in  90s), a couple 2-hr PP dinner that were normal, no lunch PP - Return with readings next visit, encouraged to try to remember to do all postprandially - On glyburide 2.5mg  qhs - US OB Limited - Fetal nonstress test - REACTIVE; AFI 15cm, normal - Restarting baby ASA, was told by a physician to stop at 34 weeks? - Next growth US on 07/19/16  3. Elderly multigravida in third trimester  4. History of prior pregnancy with SGA newborn  5. High risk pregnancy due to history of preterm labor in third trimester  6. Positive TB test   Preterm labor symptoms and general obstetric precautions including but not limited to vaginal bleeding, contractions, leaking of fluid and fetal movement were reviewed in detail with the patient. Please refer to After Visit Summary for other counseling recommendations.  Return in about 3 days (around 06/24/2016) for 2x/wk as scheduled.   Michaele OfferElizabeth Woodland [place], DO OB Fellow [facility], [facility]

## 2016-06-24 ENCOUNTER — Other Ambulatory Visit: Payer: Medicaid Other | Admitting: Obstetrics & Gynecology

## 2016-06-24 ENCOUNTER — Ambulatory Visit (INDEPENDENT_AMBULATORY_CARE_PROVIDER_SITE_OTHER): Payer: Medicaid Other | Admitting: Obstetrics & Gynecology

## 2016-06-24 VITALS — BP 97/62 | HR 105

## 2016-06-24 DIAGNOSIS — O24415 Gestational diabetes mellitus in pregnancy, controlled by oral hypoglycemic drugs: Secondary | ICD-10-CM | POA: Diagnosis present

## 2016-06-24 NOTE — Progress Notes (Signed)
NST only

## 2016-06-28 ENCOUNTER — Ambulatory Visit: Payer: Self-pay

## 2016-06-28 ENCOUNTER — Ambulatory Visit (INDEPENDENT_AMBULATORY_CARE_PROVIDER_SITE_OTHER): Payer: Medicaid Other | Admitting: Obstetrics & Gynecology

## 2016-06-28 VITALS — BP 104/72 | HR 102

## 2016-06-28 DIAGNOSIS — O24415 Gestational diabetes mellitus in pregnancy, controlled by oral hypoglycemic drugs: Secondary | ICD-10-CM

## 2016-06-28 DIAGNOSIS — Z3689 Encounter for other specified antenatal screening: Secondary | ICD-10-CM | POA: Diagnosis not present

## 2016-06-28 NOTE — Progress Notes (Signed)
NST only, reactive tracing

## 2016-06-30 ENCOUNTER — Ambulatory Visit (INDEPENDENT_AMBULATORY_CARE_PROVIDER_SITE_OTHER): Payer: Medicaid Other | Admitting: Obstetrics and Gynecology

## 2016-06-30 DIAGNOSIS — O24415 Gestational diabetes mellitus in pregnancy, controlled by oral hypoglycemic drugs: Secondary | ICD-10-CM | POA: Diagnosis present

## 2016-06-30 NOTE — Progress Notes (Signed)
NST reviewed. Reactive Contractions every 3-4 minutes noted.

## 2016-07-05 ENCOUNTER — Other Ambulatory Visit (HOSPITAL_COMMUNITY)
Admission: RE | Admit: 2016-07-05 | Discharge: 2016-07-05 | Disposition: A | Discharge status: Home / Self Care | Payer: Medicaid Other | Source: Ambulatory Visit | Attending: Certified Nurse Midwife | Admitting: Certified Nurse Midwife

## 2016-07-05 ENCOUNTER — Ambulatory Visit: Payer: Self-pay

## 2016-07-05 ENCOUNTER — Ambulatory Visit (INDEPENDENT_AMBULATORY_CARE_PROVIDER_SITE_OTHER): Payer: Medicaid Other | Admitting: Certified Nurse Midwife

## 2016-07-05 VITALS — BP 95/67 | HR 107 | Wt 176.3 lb

## 2016-07-05 DIAGNOSIS — Z113 Encounter for screening for infections with a predominantly sexual mode of transmission: Secondary | ICD-10-CM | POA: Insufficient documentation

## 2016-07-05 DIAGNOSIS — O24415 Gestational diabetes mellitus in pregnancy, controlled by oral hypoglycemic drugs: Secondary | ICD-10-CM

## 2016-07-05 DIAGNOSIS — O0993 Supervision of high risk pregnancy, unspecified, third trimester: Secondary | ICD-10-CM

## 2016-07-05 DIAGNOSIS — Z3689 Encounter for other specified antenatal screening: Secondary | ICD-10-CM

## 2016-07-05 LAB — POCT URINALYSIS DIP (DEVICE)
Bilirubin Urine: NEGATIVE
GLUCOSE, UA: NEGATIVE mg/dL
Hgb urine dipstick: NEGATIVE
Ketones, ur: NEGATIVE mg/dL
Leukocytes, UA: NEGATIVE
Nitrite: NEGATIVE
PROTEIN: NEGATIVE mg/dL
Specific Gravity, Urine: 1.015 (ref 1.005–1.030)
UROBILINOGEN UA: 0.2 mg/dL (ref 0.0–1.0)
pH: 6 (ref 5.0–8.0)

## 2016-07-05 LAB — OB RESULTS CONSOLE GBS: GBS: NEGATIVE

## 2016-07-05 NOTE — Progress Notes (Signed)
Subjective:  Samantha Lamb is a 35 y.o. [phone] at 7144w1d being seen today for ongoing prenatal care.  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  Patient reports no complaints.  Contractions: Irregular. Vag. Bleeding: None.  Movement: Present. Denies leaking of fluid.   The following portions of the patient's history were reviewed and updated as appropriate: allergies, current medications, past family history, past medical history, past social history, past surgical history and problem list. Problem list updated.  Objective:   Vitals:   07/05/16 1126  BP: 95/67  Pulse: (!) 107  Weight: 176 lb 4.8 oz (80 kg)    Fetal Status: Fetal Heart Rate (bpm): NST   Movement: Present  Presentation: Vertex  General:  Alert, oriented and cooperative. Patient is in no acute distress.  Skin: Skin is warm and dry. No rash noted.   Cardiovascular: Normal heart rate noted  Respiratory: Normal respiratory effort, no problems with respiration noted  Abdomen: Soft, gravid, appropriate for gestational age. Pain/Pressure: Present     Pelvic: Vag. Bleeding: None     Cervical exam performed        Extremities: Normal range of motion.  Edema: None  Mental Status: Normal mood and affect. Normal behavior. Normal judgment and thought content.   Urinalysis:      Assessment and Plan:  Pregnancy: G3P1103 at 9144w1d  1. Supervision of high risk pregnancy in third trimester  - Culture, beta strep (group b only) - GC/Chlamydia probe amp ([place]not [facility]  2. Gestational diabetes mellitus (GDM) controlled on oral hypoglycemic drug, antepartum  - Fetal nonstress test  reactive, AFI 13.1 - US OB Limited - FBS range 77-88; postprandial range 80-110  Term labor symptoms and general obstetric precautions including but not limited to vaginal bleeding, contractions, leaking of fluid and fetal movement were reviewed in detail with the patient. Please refer to After Visit Summary for other counseling recommendations.  Return in about 7 days (around 07/12/2016) for Ob fu and NST/AFI; 12/11 Ob fu and NST @ 0940.   Donette LarryMelanie Khalil Szczepanik, CNM

## 2016-07-07 LAB — GC/CHLAMYDIA PROBE AMP (~~LOC~~) NOT AT ARMC
Chlamydia: NEGATIVE
Neisseria Gonorrhea: NEGATIVE

## 2016-07-08 LAB — CULTURE, BETA STREP (GROUP B ONLY)

## 2016-07-09 ENCOUNTER — Encounter (HOSPITAL_COMMUNITY): Payer: Self-pay | Admitting: Advanced Practice Midwife

## 2016-07-09 ENCOUNTER — Ambulatory Visit (INDEPENDENT_AMBULATORY_CARE_PROVIDER_SITE_OTHER): Payer: Medicaid Other | Admitting: Obstetrics & Gynecology

## 2016-07-09 VITALS — BP 111/68 | HR 108

## 2016-07-09 DIAGNOSIS — O24415 Gestational diabetes mellitus in pregnancy, controlled by oral hypoglycemic drugs: Secondary | ICD-10-CM

## 2016-07-09 NOTE — Progress Notes (Signed)
Reactive NST today, f/u as scheduled

## 2016-07-12 ENCOUNTER — Ambulatory Visit (INDEPENDENT_AMBULATORY_CARE_PROVIDER_SITE_OTHER): Payer: Medicaid Other | Admitting: Obstetrics and Gynecology

## 2016-07-12 ENCOUNTER — Other Ambulatory Visit: Payer: Medicaid Other | Admitting: Medical

## 2016-07-12 ENCOUNTER — Ambulatory Visit: Payer: Self-pay

## 2016-07-12 VITALS — BP 102/60 | HR 105 | Wt 178.3 lb

## 2016-07-12 DIAGNOSIS — Z3689 Encounter for other specified antenatal screening: Secondary | ICD-10-CM

## 2016-07-12 DIAGNOSIS — Z8759 Personal history of other complications of pregnancy, childbirth and the puerperium: Secondary | ICD-10-CM

## 2016-07-12 DIAGNOSIS — O24415 Gestational diabetes mellitus in pregnancy, controlled by oral hypoglycemic drugs: Secondary | ICD-10-CM | POA: Diagnosis present

## 2016-07-12 DIAGNOSIS — O0993 Supervision of high risk pregnancy, unspecified, third trimester: Secondary | ICD-10-CM

## 2016-07-12 NOTE — Progress Notes (Signed)
Prenatal Visit Note Date: 07/12/2016 Clinic: [facility]  Subjective:  Synetta FailKishwar Szuch is a 35 y.o. [phone] at 4715w1d being seen today for ongoing prenatal care.  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  Patient reports no complaints.   Contractions: Irregular. Vag. Bleeding: None, Small.  Movement: Present. Denies leaking of fluid.   The following portions of the patient's history were reviewed and updated as appropriate: allergies, current medications, past family history, past medical history, past social history, past surgical history and problem list. Problem list updated.  Objective:   Vitals:   07/12/16 1015  BP: 102/60  Pulse: (!) 105  Weight: 178 lb 4.8 oz (80.9 kg)    Fetal Status: Fetal Heart Rate (bpm): NST Fundal Height: 37 cm Movement: Present  Presentation: Vertex  General:  Alert, oriented and cooperative. Patient is in no acute distress.  Skin: Skin is warm and dry. No rash noted.   Cardiovascular: Normal heart rate noted  Respiratory: Normal respiratory effort, no problems with respiration noted  Abdomen: Soft, gravid, appropriate for gestational age. Pain/Pressure: Present     Pelvic:  Cervical exam deferred        Extremities: Normal range of motion.  Edema: None  Mental Status: Normal mood and affect. Normal behavior. Normal judgment and thought content.   Urinalysis:      Assessment and Plan:  Pregnancy: G3P1103 at 8015w1d  1. Supervision of high risk pregnancy in third trimester GBS neg. OCPs  2. Gestational diabetes mellitus (GDM) controlled on oral hypoglycemic drug, antepartum Forgot log book but states AM fastings and 2hr PP are wnl on glyburide 2.5mg  qhs. Pt on  baby ASA. rNST (155 baseline, +accels, no decel, mod variability, toco quiet, 25minutes) and normal AFI. Rpt NST this week and has growth u/s next week. Set up IOL for 39wks next visit - Fetal nonstress test - US OB Limited  3. History of prior pregnancy with SGA newborn See above. Normal FHs  Term labor symptoms and general obstetric precautions including but not limited to vaginal bleeding, contractions, leaking of fluid and fetal movement were reviewed in detail with the patient. Please refer to After Visit Summary for other counseling recommendations.  Return for 2x/wk as scheduled.   [place] Bingharlie Evalyse Stroope, MD

## 2016-07-12 NOTE — Progress Notes (Addendum)
US for growth scheduled 12/11  Pt informed that the ultrasound is considered a limited OB ultrasound and is not intended to be a complete ultrasound exam.  Patient also informed that the ultrasound is not being completed with the intent of assessing for fetal or placental anomalies or any pelvic abnormalities.  Explained that the purpose of today's ultrasound is to assess for presentation and amniotic fluid volume.  Patient acknowledges the purpose of the exam and the limitations of the study.

## 2016-07-16 ENCOUNTER — Ambulatory Visit (INDEPENDENT_AMBULATORY_CARE_PROVIDER_SITE_OTHER): Payer: Medicaid Other | Admitting: Family Medicine

## 2016-07-16 VITALS — BP 96/60 | HR 96 | Wt 177.9 lb

## 2016-07-16 DIAGNOSIS — O0993 Supervision of high risk pregnancy, unspecified, third trimester: Secondary | ICD-10-CM

## 2016-07-16 DIAGNOSIS — O24419 Gestational diabetes mellitus in pregnancy, unspecified control: Secondary | ICD-10-CM

## 2016-07-16 NOTE — Progress Notes (Signed)
Reactive NST 

## 2016-07-16 NOTE — Progress Notes (Signed)
nst

## 2016-07-16 NOTE — Progress Notes (Addendum)
Scheduled for IOL 12/17./19 0800

## 2016-07-16 NOTE — Addendum Note (Signed)
Addended by: Gerome ApleyZEYFANG, LINDA L on: 07/16/2016 10:34 AM   Modules accepted: Orders

## 2016-07-19 ENCOUNTER — Encounter (HOSPITAL_COMMUNITY): Payer: Self-pay

## 2016-07-19 ENCOUNTER — Ambulatory Visit (INDEPENDENT_AMBULATORY_CARE_PROVIDER_SITE_OTHER): Payer: Medicaid Other | Admitting: Obstetrics and Gynecology

## 2016-07-19 ENCOUNTER — Ambulatory Visit (HOSPITAL_COMMUNITY)
Admission: RE | Admit: 2016-07-19 | Discharge: 2016-07-19 | Disposition: A | Discharge status: Home / Self Care | Payer: Medicaid Other | Source: Ambulatory Visit | Attending: Advanced Practice Midwife | Admitting: Advanced Practice Midwife

## 2016-07-19 VITALS — BP 102/64 | HR 97 | Wt 177.6 lb

## 2016-07-19 DIAGNOSIS — O09213 Supervision of pregnancy with history of pre-term labor, third trimester: Secondary | ICD-10-CM | POA: Insufficient documentation

## 2016-07-19 DIAGNOSIS — Z8759 Personal history of other complications of pregnancy, childbirth and the puerperium: Secondary | ICD-10-CM

## 2016-07-19 DIAGNOSIS — Z363 Encounter for antenatal screening for malformations: Secondary | ICD-10-CM | POA: Insufficient documentation

## 2016-07-19 DIAGNOSIS — Z3A38 38 weeks gestation of pregnancy: Secondary | ICD-10-CM | POA: Insufficient documentation

## 2016-07-19 DIAGNOSIS — O09523 Supervision of elderly multigravida, third trimester: Secondary | ICD-10-CM | POA: Insufficient documentation

## 2016-07-19 DIAGNOSIS — O24415 Gestational diabetes mellitus in pregnancy, controlled by oral hypoglycemic drugs: Secondary | ICD-10-CM

## 2016-07-19 DIAGNOSIS — O0993 Supervision of high risk pregnancy, unspecified, third trimester: Secondary | ICD-10-CM

## 2016-07-19 NOTE — Progress Notes (Signed)
Prenatal Visit Note Date: 07/19/2016 Clinic: [facility]  Subjective:  Samantha Lamb is a 35 y.o. G3P1103 at [redacted]w[redacted]d being seen today for ongoing prenatal care.  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  Patient reports no complaints.   Contractions: Irregular. Vag. Bleeding: None.  Movement: Present. Denies leaking of fluid.   The following portions of the patient's history were reviewed and updated as appropriate: allergies, current medications, past family history, past medical history, past social history, past surgical history and problem list. Problem list updated.  Objective:   Vitals:   07/19/16 1014  BP: 102/64  Pulse: 97  Weight: 177 lb 9.6 oz (80.6 kg)    Fetal Status: Fetal Heart Rate (bpm): NST   Movement: Present     General:  Alert, oriented and cooperative. Patient is in no acute distress.  Skin: Skin is warm and dry. No rash noted.   Cardiovascular: Normal heart rate noted  Respiratory: Normal respiratory effort, no problems with respiration noted  Abdomen: Soft, gravid, appropriate for gestational age. Pain/Pressure: Present     Pelvic:  Cervical exam deferred        Extremities: Normal range of motion.  Edema: None  Mental Status: Normal mood and affect. Normal behavior. Normal judgment and thought content.   Urinalysis:      Assessment and Plan:  Pregnancy: G3P1103 at [redacted]w[redacted]d  1. Supervision of high risk pregnancy in third trimester Routine care. OCPs  2. Gestational diabetes mellitus (GDM) controlled on oral hypoglycemic drug, antepartum Forgot log book but states AM fastings and 2hr PP are wnl on glyburide 2.5mg  qhs. Pt on baby ASA. rNST (145 baseli<[id number]<MEASURE16109NT224 83[facility]<MEASURE16109NT303-21[facility]<MEASURE16109NT919-70Alcide [facility]<MEASURE16109NT2484Alcide<MEASURE16109NT316-7<MEASURE16109NT346-65[facility]<MEASURE16109NT(470)08[facility]ide [facility]<MEASURE16109NT304-41[facility]<MEASURE16109NT670 34Alcide [facility]<MEASURE16109NT250 62Alcide [facility] N<MEASURE16109NT781-88Alcide M<MEASURE16109NT(660) 40[facility]ide [facility]509161Janene HaKentucGeorgiKentuckyakyrv09Russian Ma7<MEASURE16109NT914-44Alcide Seatt<MEASURE16109NT770-79Alcide [facility]<MEASURE16109NT510-20[facility]cide [facility] 410Rebecka [facility]651-6161Janene HaKentuckyrvey6GeorgiKentuckya295(0Ma725re9 4, AC 95%, normal afi, cephalic  3. History of prior pregnancy with SGA newborn See above  Term labor symptoms and general obstetric precautions including but not limited to vaginal bleeding, contractions, leaking of fluid and fetal movement were reviewed in detail with the patient. Please refer to After Visit Summary for other counseling recommendations.  Return in about 3 days (around 07/22/2016) for NST as scheduled.   Aleesa Sweigert, MD

## 2016-07-19 NOTE — Progress Notes (Signed)
US for growth done today.  IOL scheduled 12/17.

## 2016-07-20 ENCOUNTER — Telehealth (HOSPITAL_COMMUNITY): Payer: Self-pay | Admitting: *Deleted

## 2016-07-20 ENCOUNTER — Encounter (HOSPITAL_COMMUNITY): Payer: Self-pay | Admitting: *Deleted

## 2016-07-20 NOTE — Telephone Encounter (Signed)
Preadmission screen  

## 2016-07-22 ENCOUNTER — Other Ambulatory Visit: Payer: Medicaid Other | Admitting: Obstetrics & Gynecology

## 2016-07-22 ENCOUNTER — Ambulatory Visit (INDEPENDENT_AMBULATORY_CARE_PROVIDER_SITE_OTHER): Payer: Medicaid Other | Admitting: *Deleted

## 2016-07-22 VITALS — BP 94/62 | HR 103

## 2016-07-22 DIAGNOSIS — O24415 Gestational diabetes mellitus in pregnancy, controlled by oral hypoglycemic drugs: Secondary | ICD-10-CM

## 2016-07-22 NOTE — Progress Notes (Signed)
IOL scheduled 12/17

## 2016-07-25 ENCOUNTER — Inpatient Hospital Stay (HOSPITAL_COMMUNITY)
Admission: RE | Admit: 2016-07-25 | Discharge: 2016-07-27 | DRG: 775 | Disposition: A | Discharge status: Home / Self Care | Payer: Medicaid Other | Source: Ambulatory Visit | Attending: Obstetrics and Gynecology | Admitting: Obstetrics and Gynecology

## 2016-07-25 ENCOUNTER — Inpatient Hospital Stay (HOSPITAL_COMMUNITY): Payer: Medicaid Other | Admitting: Anesthesiology

## 2016-07-25 ENCOUNTER — Encounter (HOSPITAL_COMMUNITY): Payer: Self-pay

## 2016-07-25 DIAGNOSIS — O24425 Gestational diabetes mellitus in childbirth, controlled by oral hypoglycemic drugs: Principal | ICD-10-CM | POA: Diagnosis present

## 2016-07-25 DIAGNOSIS — O24429 Gestational diabetes mellitus in childbirth, unspecified control: Secondary | ICD-10-CM | POA: Diagnosis not present

## 2016-07-25 DIAGNOSIS — Z833 Family history of diabetes mellitus: Secondary | ICD-10-CM | POA: Diagnosis not present

## 2016-07-25 DIAGNOSIS — Z3A39 39 weeks gestation of pregnancy: Secondary | ICD-10-CM

## 2016-07-25 DIAGNOSIS — O24419 Gestational diabetes mellitus in pregnancy, unspecified control: Secondary | ICD-10-CM | POA: Diagnosis present

## 2016-07-25 LAB — TYPE AND SCREEN
ABO/RH(D): O POS
ANTIBODY SCREEN: NEGATIVE

## 2016-07-25 LAB — GLUCOSE, CAPILLARY
GLUCOSE-CAPILLARY: 71 mg/dL (ref 65–99)
GLUCOSE-CAPILLARY: 61 mg/dL — AB (ref 65–99)
Glucose-Capillary: 75 mg/dL (ref 65–99)
Glucose-Capillary: 77 mg/dL (ref 65–99)

## 2016-07-25 LAB — CBC
HEMATOCRIT: 39.1 % (ref 36.0–46.0)
HEMOGLOBIN: 13.3 g/dL (ref 12.0–15.0)
MCH: 29 pg (ref 26.0–34.0)
MCHC: 34 g/dL (ref 30.0–36.0)
MCV: 85.4 fL (ref 78.0–100.0)
Platelets: 192 10*3/uL (ref 150–400)
RBC: 4.58 MIL/uL (ref 3.87–5.11)
RDW: 17.1 % — ABNORMAL HIGH (ref 11.5–15.5)
WBC: 10.1 10*3/uL (ref 4.0–10.5)

## 2016-07-25 LAB — ABO/RH: ABO/RH(D): O POS

## 2016-07-25 MED ORDER — EPHEDRINE 5 MG/ML INJ: 10.0000 mg | INTRAVENOUS | Status: DC | PRN

## 2016-07-25 MED ORDER — SOD CITRATE-CITRIC ACID 500-334 MG/5ML PO SOLN: 30.0000 mL | ORAL | Status: DC | PRN

## 2016-07-25 MED ORDER — PHENYLEPHRINE 40 MCG/ML (10ML) SYRINGE FOR IV PUSH (FOR BLOOD PRESSURE SUPPORT): 80.0000 ug | PREFILLED_SYRINGE | INTRAVENOUS | Status: DC | PRN

## 2016-07-25 MED ORDER — LACTATED RINGERS IV SOLN: 500.0000 mL | INTRAVENOUS | Status: DC | PRN

## 2016-07-25 MED ORDER — ACETAMINOPHEN 325 MG PO TABS: 650.0000 mg | ORAL_TABLET | ORAL | Status: DC | PRN

## 2016-07-25 MED ORDER — OXYTOCIN 40 UNITS IN LACTATED RINGERS INFUSION - SIMPLE MED: 2.5000 [IU]/h | INTRAVENOUS | Status: DC

## 2016-07-25 MED ORDER — LACTATED RINGERS IV SOLN: INTRAVENOUS | Status: DC

## 2016-07-25 MED ORDER — DIPHENHYDRAMINE HCL 50 MG/ML IJ SOLN: 12.5000 mg | INTRAMUSCULAR | Status: DC | PRN

## 2016-07-25 MED ORDER — FENTANYL 2.5 MCG/ML BUPIVACAINE 1/10 % EPIDURAL INFUSION (WH - ANES)
INTRAMUSCULAR | Status: AC
  Filled 2016-07-25: qty 100

## 2016-07-25 MED ORDER — OXYCODONE-ACETAMINOPHEN 5-325 MG PO TABS
1.0000 | ORAL_TABLET | ORAL | Status: DC | PRN
Start: 2016-07-25 — End: 2016-07-26

## 2016-07-25 MED ORDER — FENTANYL CITRATE (PF) 100 MCG/2ML IJ SOLN
50.0000 ug | INTRAMUSCULAR | Status: DC | PRN
  Administered 2016-07-25 (×6): 100 ug via INTRAVENOUS
  Filled 2016-07-25 (×6): qty 2

## 2016-07-25 MED ORDER — LACTATED RINGERS IV SOLN
INTRAVENOUS | Status: DC
  Administered 2016-07-25 (×2): via INTRAVENOUS

## 2016-07-25 MED ORDER — PHENYLEPHRINE 40 MCG/ML (10ML) SYRINGE FOR IV PUSH (FOR BLOOD PRESSURE SUPPORT)
PREFILLED_SYRINGE | INTRAVENOUS | Status: AC
  Filled 2016-07-25: qty 20

## 2016-07-25 MED ORDER — FLEET ENEMA 7-19 GM/118ML RE ENEM: 1.0000 | ENEMA | RECTAL | Status: DC | PRN

## 2016-07-25 MED ORDER — OXYTOCIN 40 UNITS IN LACTATED RINGERS INFUSION - SIMPLE MED
2.5000 [IU]/h | Freq: Once | INTRAVENOUS | Status: AC | PRN
  Administered 2016-07-26: 2.5 [IU]/h via INTRAVENOUS
  Filled 2016-07-25: qty 1000

## 2016-07-25 MED ORDER — OXYTOCIN BOLUS FROM INFUSION
500.0000 mL | Freq: Once | INTRAVENOUS | Status: AC | PRN
  Administered 2016-07-26: 500 mL via INTRAVENOUS

## 2016-07-25 MED ORDER — FENTANYL 2.5 MCG/ML BUPIVACAINE 1/10 % EPIDURAL INFUSION (WH - ANES)
14.0000 mL/h | INTRAMUSCULAR | Status: DC | PRN
  Administered 2016-07-25: 14 mL/h via EPIDURAL

## 2016-07-25 MED ORDER — LIDOCAINE HCL (PF) 1 % IJ SOLN: 30.0000 mL | INTRAMUSCULAR | Status: DC | PRN

## 2016-07-25 MED ORDER — LACTATED RINGERS IV SOLN
500.0000 mL | Freq: Once | INTRAVENOUS | Status: AC
  Administered 2016-07-25: 500 mL via INTRAVENOUS

## 2016-07-25 MED ORDER — OXYCODONE-ACETAMINOPHEN 5-325 MG PO TABS: 2.0000 | ORAL_TABLET | ORAL | Status: DC | PRN

## 2016-07-25 MED ORDER — TERBUTALINE SULFATE 1 MG/ML IJ SOLN
0.2500 mg | Freq: Once | INTRAMUSCULAR | Status: DC | PRN
  Filled 2016-07-25: qty 1

## 2016-07-25 MED ORDER — OXYTOCIN 40 UNITS IN LACTATED RINGERS INFUSION - SIMPLE MED
1.0000 m[IU]/min | INTRAVENOUS | Status: DC
  Administered 2016-07-25: 2 m[IU]/min via INTRAVENOUS
  Administered 2016-07-25: 20 m[IU]/min via INTRAVENOUS

## 2016-07-25 MED ORDER — LIDOCAINE HCL (PF) 1 % IJ SOLN
30.0000 mL | INTRAMUSCULAR | Status: DC | PRN
Start: 2016-07-25 — End: 2016-07-26
  Filled 2016-07-25: qty 30

## 2016-07-25 MED ORDER — ONDANSETRON HCL 4 MG/2ML IJ SOLN: 4.0000 mg | Freq: Four times a day (QID) | INTRAMUSCULAR | Status: DC | PRN

## 2016-07-25 MED ORDER — LIDOCAINE HCL (PF) 1 % IJ SOLN
INTRAMUSCULAR | Status: DC | PRN
  Administered 2016-07-25: 5 mL via EPIDURAL
  Administered 2016-07-25: 7 mL via EPIDURAL

## 2016-07-25 MED ORDER — OXYTOCIN BOLUS FROM INFUSION: 500.0000 mL | Freq: Once | INTRAVENOUS | Status: DC

## 2016-07-25 NOTE — Anesthesia Pain Management Evaluation Note (Signed)
  CRNA Pain Management Visit Note  Patient: Samantha Lamb, 35 y.o., female  "Hello [facility] am a [facility]. We have an anesthesia team available at all times to provide care throughout the hospital, including epidural management and anesthesia for C-section. I don't know your plan for the delivery whether it a natural birth, water birth, IV sedation, nitrous supplementation, doula or epidural, but we want to meet your pain goals."   1.Was your pain managed to your expectations on prior hospitalizations?   No   2.What is your expectation for pain management during this hospitalization?     Epidural  3.How can we help you reach that goal?   Record the patient's initial score and the patient's pain goal.   Pain: 3  Pain Goal: 8 [facility] wants you to be able to say your pain was always managed very well.  Laban EmperorMalinova,Samantha Lamb 07/25/2016

## 2016-07-25 NOTE — Anesthesia Procedure Notes (Signed)
Epidural Patient location during procedure: OB Start time: 07/25/2016 11:07 PM End time: 07/25/2016 11:11 PM  Staffing Anesthesiologist: Leilani AbleHATCHETT, Orlando Thalmann Performed: anesthesiologist   Preanesthetic Checklist Completed: patient identified, surgical consent, pre-op evaluation, timeout performed, IV checked, risks and benefits discussed and monitors and equipment checked  Epidural Patient position: sitting Prep: site prepped and draped and DuraPrep Patient monitoring: continuous pulse ox and blood pressure Approach: midline Location: L3-L4 Injection technique: LOR air  Needle:  Needle type: Tuohy  Needle gauge: 17 G Needle length: 9 cm and 9 Needle insertion depth: 4 cm Catheter type: closed end flexible Catheter size: 19 Gauge Catheter at skin depth: 9 cm Test dose: negative and Other  Assessment Sensory level: T9 Events: blood not aspirated, injection not painful, no injection resistance, negative IV test and no paresthesia  Additional Notes Reason for block:procedure for pain

## 2016-07-25 NOTE — Anesthesia Preprocedure Evaluation (Addendum)
Anesthesia Evaluation  Patient identified by MRN, date of birth, ID band Patient awake    Reviewed: Allergy & Precautions, H&P , NPO status , Patient's Chart, lab work & pertinent test results  Airway Mallampati: I  TM Distance: >3 FB Neck ROM: full    Dental no notable dental hx.    Pulmonary neg pulmonary ROS,    Pulmonary exam normal        Cardiovascular negative cardio ROS Normal cardiovascular exam     Neuro/Psych negative neurological ROS  negative psych ROS   GI/Hepatic negative GI ROS, Neg liver ROS,   Endo/Other  diabetes, Gestational, Oral Hypoglycemic Agents  Renal/GU negative Renal ROS     Musculoskeletal   Abdominal Normal abdominal exam  (+)   Peds  Hematology negative hematology ROS (+)   Anesthesia Other Findings   Reproductive/Obstetrics (+) Pregnancy                            Anesthesia Physical Anesthesia Plan  ASA: II  Anesthesia Plan: Epidural   Post-op Pain Management:    Induction:   Airway Management Planned:   Additional Equipment:   Intra-op Plan:   Post-operative Plan:   Informed Consent: I have reviewed the patients History and Physical, chart, labs and discussed the procedure including the risks, benefits and alternatives for the proposed anesthesia with the patient or authorized representative who has indicated his/her understanding and acceptance.     Plan Discussed with:   Anesthesia Plan Comments:         Anesthesia Quick Evaluation

## 2016-07-25 NOTE — H&P (Signed)
LABOR AND DELIVERY ADMISSION HISTORY AND PHYSICAL NOTE  Samantha Lamb is a 35 y.o. female [phone] with IUP at 6210w0d by 13 wk US presenting for IOL for GDMA2/B.   She reports positive fetal movement. She denies leakage of fluid or vaginal bleeding.   Past Medical History: Past Medical History:  Diagnosis Date  . Gestational diabetes    glyburide at night  . Hyperlipemia   . Medical history non-contributory   . Pregnant     Past Surgical History: Past Surgical History:  Procedure Laterality Date  . NO PAST SURGERIES      Obstetrical History: OB History    Gravida Para Term Preterm AB Living   4 2 1 1  0 3   SAB TAB Ectopic Multiple Live Births   0 0 0 1 3      Social History: Social History   Social History  . Marital status: Married    Spouse name: N/A  . Number of children: N/A  . Years of education: N/A   Social History Main Topics  . Smoking status: Never Smoker  . Smokeless tobacco: Never Used  . Alcohol use No  . Drug use: No  . Sexual activity: Yes   Other Topics Concern  . Not on file   Social History Narrative  . No narrative on file    Family History: Family History  Problem Relation Age of Onset  . Diabetes Mother   . Diabetes Father     Allergies: Allergies  Allergen Reactions  . Penicillins Hives, Itching and Other (See Comments)    Has patient had a PCN reaction causing immediate rash, facial/tongue/throat swelling, SOB or lightheadedness with hypotension: Yes Has patient had a PCN reaction causing severe rash involving mucus membranes or skin necrosis: No Has patient had a PCN reaction that required hospitalization No Has patient had a PCN reaction occurring within the last 10 years: No If all of the above answers are "NO", then may proceed with Cephalosporin use.    Prescriptions Prior to Admission  Medication Sig Dispense Refill Last Dose  . glyBURIDE (DIABETA) 2.5 MG tablet Take 1 tablet (2.5 mg total) by mouth at bedtime. 30  tablet 3 07/24/2016 at Unknown time  . prenatal vitamin w/FE, FA (PRENATAL 1 + 1) 27-1 MG TABS tablet Take 1 tablet by mouth at bedtime.   07/24/2016 at Unknown time     Review of Systems   All systems reviewed and negative except as stated in HPI  Blood pressure 102/72, pulse (!) 106, temperature 98 F (36.7 C), temperature source Oral, resp. rate 18, height 5\' 3"  (1.6 m), weight 173 lb (78.5 kg). General appearance: alert, cooperative, appears stated age and no distress Lungs: clear to auscultation bilaterally Heart: regular rate and rhythm Abdomen: soft, non-tender; bowel sounds normal Extremities: No calf swelling or tenderness Presentation: cephalic Fetal monitoring: 150 bpm, mod var, +accels, no decels Uterine activity: Rare Dilation: 2.5 Effacement (%): 50 Station: -3 Exam by:: middleton rn   Prenatal labs: ABO, Rh: O/Positive/-- (08/21 0000) Antibody: Negative (08/21 0000) Rubella: !Error!  IMMUNE RPR: NON REAC (10/03 0906)  HBsAg: Negative (08/21 0000)  HIV: NONREACTIVE (10/03 0906)  GBS:    Neg 1 hr Glucola: 175 (early); 3 hr GTT (early) 120/221/156/115 (POSITIVE) Genetic screening:  Too late Anatomy US: Normal, EFW 84% at 38 wks  Prenatal Transfer Tool  Maternal Diabetes: Yes:  Diabetes Type:  Pre-pregnancy, Insulin/Medication controlled Genetic Screening: Declined Maternal Ultrasounds/Referrals: Normal Fetal Ultrasounds or other Referrals:  None Maternal Substance Abuse:  No Significant Maternal Medications:  Meds include: Other: Glyburide 2.5 mg qhs Significant Maternal Lab Results: Lab values include: Group B Strep negative  Results for orders placed or performed during the hospital encounter of 07/25/16 (from the past 24 hour(s))  Glucose, capillary   Collection Time: 07/25/16  8:56 AM  Result Value Ref Range   Glucose-Capillary 75 65 - 99 mg/dL    Patient Active Problem List   Diagnosis Date Noted  . Labor and delivery, indication for care  07/25/2016  . Gestational diabetes 07/25/2016  . 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 Gestational diabetes mellitus, antepartum 04/19/2016  . History of prior pregnancy with SGA newborn 04/19/2016  . Supervision of high risk pregnancy in third trimester 04/15/2016  . Advanced maternal age in multigravida     Assessment: Samantha Lamb is a 35 y.o. [phone] at 2663w0d here for IOL 2/2 GDMA2/B.   #Labor:Pitocin #Pain: Epidural on request, IV pain meds as needed #FWB: Cat I #ID:  GBS Neg #MOF: Breast/bottle #MOC:OCPs, maybe considering interval BTL #Circ:  Outpatient   Jen MowElizabeth Mumaw, DO OB Fellow [facility] 07/25/2016, 9:18 AM

## 2016-07-25 NOTE — Progress Notes (Signed)
Patient ID: Samantha Lamb, female   DOB: 10-20-1980, 35 y.o.   MRN: [id number]  S: Patient seen & examined for progress of labor. Patient comfortable in room, standing and breathing through contractions, states she does not need pain medication right now.    O:  Vitals:   07/25/16 1300 07/25/16 1330 07/25/16 1400 07/25/16 1430  BP: 113/74 115/71 115/72 110/76  Pulse: 84 87 85 80  Resp:      Temp:      TempSrc:      Weight:      Height:        Dilation: 3 Effacement (%): 80 Cervical Position: Middle Station: -2 Presentation: Vertex Exam by:: middleton rn   FHT: 140 bpm, mod var, +accels, no decels TOCO: q2-183min   A/P: Pitocin is at 16 milliunits, continue per protocol, will consider AROM and IUPC if no further change BS controlled, last BS 71 Continue expectant management Anticipate SVD

## 2016-07-26 ENCOUNTER — Encounter (HOSPITAL_COMMUNITY): Payer: Self-pay

## 2016-07-26 DIAGNOSIS — O24429 Gestational diabetes mellitus in childbirth, unspecified control: Secondary | ICD-10-CM

## 2016-07-26 DIAGNOSIS — Z3A39 39 weeks gestation of pregnancy: Secondary | ICD-10-CM

## 2016-07-26 LAB — RPR: RPR: NONREACTIVE

## 2016-07-26 LAB — GLUCOSE, CAPILLARY: Glucose-Capillary: 148 mg/dL — ABNORMAL HIGH (ref 65–99)

## 2016-07-26 MED ORDER — ACETAMINOPHEN 325 MG PO TABS: 650.0000 mg | ORAL_TABLET | ORAL | Status: DC | PRN

## 2016-07-26 MED ORDER — ONDANSETRON HCL 4 MG PO TABS
4.0000 mg | ORAL_TABLET | ORAL | Status: DC | PRN
Start: 2016-07-26 — End: 2016-07-27

## 2016-07-26 MED ORDER — DIBUCAINE 1 % RE OINT: 1.0000 "application " | TOPICAL_OINTMENT | RECTAL | Status: DC | PRN

## 2016-07-26 MED ORDER — DIPHENHYDRAMINE HCL 25 MG PO CAPS: 25.0000 mg | ORAL_CAPSULE | Freq: Four times a day (QID) | ORAL | Status: DC | PRN

## 2016-07-26 MED ORDER — SENNOSIDES-DOCUSATE SODIUM 8.6-50 MG PO TABS
2.0000 | ORAL_TABLET | ORAL | Status: DC
  Administered 2016-07-27: 2 via ORAL
  Filled 2016-07-26: qty 2

## 2016-07-26 MED ORDER — IBUPROFEN 600 MG PO TABS
600.0000 mg | ORAL_TABLET | Freq: Four times a day (QID) | ORAL | Status: DC
  Administered 2016-07-26 – 2016-07-27 (×6): 600 mg via ORAL
  Filled 2016-07-26 (×6): qty 1

## 2016-07-26 MED ORDER — SIMETHICONE 80 MG PO CHEW: 80.0000 mg | CHEWABLE_TABLET | ORAL | Status: DC | PRN

## 2016-07-26 MED ORDER — WITCH HAZEL-GLYCERIN EX PADS: 1.0000 "application " | MEDICATED_PAD | CUTANEOUS | Status: DC | PRN

## 2016-07-26 MED ORDER — ZOLPIDEM TARTRATE 5 MG PO TABS: 5.0000 mg | ORAL_TABLET | Freq: Every evening | ORAL | Status: DC | PRN

## 2016-07-26 MED ORDER — BENZOCAINE-MENTHOL 20-0.5 % EX AERO
1.0000 "application " | INHALATION_SPRAY | CUTANEOUS | Status: DC | PRN
  Administered 2016-07-26: 1 via TOPICAL
  Filled 2016-07-26: qty 56

## 2016-07-26 MED ORDER — COCONUT OIL OIL: 1.0000 "application " | TOPICAL_OIL | Status: DC | PRN

## 2016-07-26 MED ORDER — PRENATAL MULTIVITAMIN CH
1.0000 | ORAL_TABLET | Freq: Every day | ORAL | Status: DC
  Administered 2016-07-26 – 2016-07-27 (×2): 1 via ORAL
  Filled 2016-07-26 (×2): qty 1

## 2016-07-26 MED ORDER — ONDANSETRON HCL 4 MG/2ML IJ SOLN: 4.0000 mg | INTRAMUSCULAR | Status: DC | PRN

## 2016-07-26 MED ORDER — TETANUS-DIPHTH-ACELL PERTUSSIS 5-2.5-18.5 LF-MCG/0.5 IM SUSP: 0.5000 mL | Freq: Once | INTRAMUSCULAR | Status: DC

## 2016-07-26 NOTE — Progress Notes (Signed)
This note also relates to the following rows which could not be included: CBG Lab Component - View only - Cannot attach notes to extension rows  Pt arrived on unit and wanted to eat after delivery.  Did fasting blood sugar early and pt stated she just had apple juice in L&D.  BS 148.

## 2016-07-26 NOTE — Progress Notes (Addendum)
Patient ID: Samantha Lamb, female   DOB: 1981-01-01, 35 y.o.   MRN: [id number]  CTSP for FHR variables to 90s. Cx had progressed to 8/90/0 and FHR had recovered to 130s by the time I arrived. Pit had been stopped. Ctx were q 2 mins. Now FHR 150s. Had had SROM @ 2000. Will cont to observe.  Cam HaiSHAW, Shyne Lehrke CNM 07/26/2016 12:31 AM

## 2016-07-26 NOTE — Anesthesia Postprocedure Evaluation (Signed)
Anesthesia Post Note  Patient: Samantha Lamb  Procedure(s) Performed: * No procedures listed *  Patient location during evaluation: Mother Baby Anesthesia Type: Epidural Level of consciousness: awake, awake and alert, oriented and patient cooperative Pain management: pain level controlled Vital Signs Assessment: post-procedure vital signs reviewed and stable Respiratory status: spontaneous breathing, nonlabored ventilation and respiratory function stable Cardiovascular status: stable Postop Assessment: patient able to bend at knees, no headache, no backache and no signs of nausea or vomiting Anesthetic complications: no     Last Vitals:  Vitals:   07/26/16 0336 07/26/16 0430  BP: (!) 118/57 108/63  Pulse: 75 79  Resp: 18 18  Temp: 36.8 C 37.4 C    Last Pain:  Vitals:   07/26/16 0430  TempSrc: Oral  PainSc: 0-No pain   Pain Goal:                 Amanda Steuart L

## 2016-07-26 NOTE — Lactation Note (Signed)
This note was copied from a baby's chart. Lactation Consultation Note  Patient Name: Samantha Lamb ZOXWR'UToday's Date: 07/26/2016 Reason for consult: Initial assessment  Baby 9 hours old. Mom just placing baby back in crib after nursing. Mom reports that this baby is latching and nursing well and she is hearing swallows while baby at the breast. Mom states that she is seeing transitional milk when she hand expressing. Mom also reports that she is able to nurse better in a sitting position, so enc mom to continue if this is more comfortable. Enc mom to continue to nurse with cues. [facility], aware of OP/BFSG and LC phone line assistance after D/C.   Maternal Data Has patient been taught Hand Expression?: Yes Does the patient have breastfeeding experience prior to this delivery?: Yes  Feeding Feeding Type: Breast Fed Length of feed: 15 min  LATCH Score/Interventions Latch: Grasps breast easily, tongue down, lips flanged, rhythmical sucking.  Audible Swallowing: A few with stimulation  Type of Nipple: Everted at rest and after stimulation  Comfort (Breast/Nipple): Soft / non-tender     Hold (Positioning): No assistance needed to correctly position infant at breast.  LATCH Score: 9  Lactation Tools Discussed/Used     Consult Status Consult Status: Follow-up Date: 07/27/16 Follow-up type: In-patient    Sherlyn HayJennifer D Erich Kochan 07/26/2016, 11:28 AM

## 2016-07-27 LAB — GLUCOSE, CAPILLARY: GLUCOSE-CAPILLARY: 110 mg/dL — AB (ref 65–99)

## 2016-07-27 MED ORDER — IBUPROFEN 600 MG PO TABS
600.0000 mg | ORAL_TABLET | Freq: Four times a day (QID) | ORAL | 0 refills | Status: AC | PRN
Start: 2016-07-27 — End: ?

## 2016-07-27 NOTE — Discharge Summary (Signed)
OB Discharge Summary     Patient Name: Samantha Lamb DOB: 17-Aug-1980 MRN: [id number]  Date of admission: 07/25/2016 Delivering MD: Cam HaiSHAW, Tyreesha Maharaj D   Date of discharge: 07/27/2016  Admitting diagnosis: Induction  Intrauterine pregnancy: 3100w1d     Secondary diagnosis:  Active Problems:   Labor and delivery, indication for care   Gestational diabetes  Additional problems: none     Discharge diagnosis: Term Pregnancy Delivered and GDM A2                                                                                                Post partum procedures:none  Augmentation: Pitocin  Complications: None  Hospital course:  Induction of Labor With Vaginal Delivery     35 y.o. yo [id number] at 20100w1d was admitted for IOL 2/2 GDM A2/B on 07/25/2016. Patient had an uncomplicated labor course as follows:  Membrane Rupture Time/Date: 8:00 PM ,07/25/2016   Intrapartum Procedures: Episiotomy: None [1]                                         Lacerations:  1st degree [2];Perineal [11]  Patient had a delivery of a Viable infant. 07/26/2016  Information for the patient's newborn:  Samantha Lamb, Samantha Lamb [[id number]][[id number]]  Delivery Method: Vaginal, Spontaneous Delivery (Filed from Delivery Summary)    Pateint had an uncomplicated postpartum course.  She is ambulating, tolerating a regular diet, passing flatus, and urinating well. Patient is discharged home in stable condition on 07/27/16.    Physical exam Vitals:   07/26/16 0830 07/26/16 1630 07/26/16 1744 07/27/16 0546  BP: 110/60 100/67 99/60 106/73  Pulse: 80 84 89 83  Resp: 16 18 18 18   Temp:  98.7 F (37.1 C) 98 F (36.7 C) 98.3 F (36.8 C)  TempSrc:  Oral Oral Oral  SpO2:      Weight:      Height:       General: alert, cooperative and no distress Lochia: appropriate Uterine Fundus: firm DVT Evaluation: No evidence of DVT seen on physical exam. Labs: Lab Results  Component Value Date   WBC 10.1 07/25/2016   HGB 13.3  07/25/2016   HCT 39.1 07/25/2016   MCV 85.4 07/25/2016   PLT 192 07/25/2016   CMP Latest Ref Rng & Units 02/05/2014  Glucose 70 - 99 mg/dL 696(E131(H)  BUN 6 - 23 mg/dL 13  Creatinine 9.520.50 - 8.411.10 mg/dL 3.240.58  Sodium 401137 - 027147 mEq/L 138  Potassium 3.7 - 5.3 mEq/L 5.2  Chloride 96 - 112 mEq/L 102  CO2 19 - 32 mEq/L 19  Calcium 8.4 - 10.5 mg/dL 8.8  Total Protein 6.0 - 8.3 g/dL 7.8  Total Bilirubin 0.3 - 1.2 mg/dL <2.5(D<0.2(L)  Alkaline Phos 39 - 117 U/L 76  AST 0 - 37 U/L 33  ALT 0 - 35 U/L 12    Discharge instruction: per After Visit Summary and "Baby and Me Booklet".  After visit meds:  Allergies as of 07/27/2016  Reactions   Penicillins Hives, Itching, Other (See Comments)   Has patient had a PCN reaction causing immediate rash, facial/tongue/throat swelling, SOB or lightheadedness with hypotension: Yes Has patient had a PCN reaction causing severe rash involving mucus membranes or skin necrosis: No Has patient had a PCN reaction that required hospitalization No Has patient had a PCN reaction occurring within the last 10 years: No If all of the above answers are "NO", then may proceed with Cephalosporin use.      Medication List    STOP taking these medications   glyBURIDE 2.5 MG tablet Commonly known as:  DIABETA     TAKE these medications   ibuprofen 600 MG tablet Commonly known as:  ADVIL,MOTRIN Take 1 tablet (600 mg total) by mouth every 6 (six) hours as needed.   prenatal vitamin w/FE, FA 27-1 MG Tabs tablet Take 1 tablet by mouth at bedtime.       Diet: routine diet  Activity: Advance as tolerated. Pelvic rest for 6 weeks.   Outpatient follow up:6 weeks Follow up Appt:No future appointments. Follow up Visit:No Follow-up on file.  Postpartum contraception: Progesterone only pills  Newborn Data: Live born female  Birth Weight: 7 lb 4.8 oz (3310 g) APGAR: 8, 9  Baby Feeding: Breast Disposition:home with mother   07/27/2016 Clearance CootsAndrew Tyson, MD   CNM  attestation I have seen and examined this patient and agree with above documentation in the resident's note.   Samantha Lamb is a 35 y.o. [phone] s/p SVD.   Pain is well controlled.  Plan for birth control is oral progesterone-only contraceptive.  Method of Feeding: breast  PE:  BP 106/73 (BP Location: Right Arm)   Pulse 83   Temp 98.3 F (36.8 C) (Oral)   Resp 18   Ht 5\' 3"  (1.6 m)   Wt 78.5 kg (173 lb)   SpO2 100%   Breastfeeding? Unknown   BMI 30.65 kg/m  Fundus firm   Recent Labs  07/25/16 0857  HGB 13.3  HCT 39.1     Plan: discharge today - postpartum care discussed - f/u clinic in 4-6 weeks for postpartum visit   Cam HaiSHAW, Samantha Lamb, CNM 9:35 AM  07/27/2016

## 2016-07-27 NOTE — Lactation Note (Signed)
This note was copied from a baby's chart. Lactation Consultation Note  Patient Name: Samantha Lamb ZOXWR'UToday's Date: 07/27/2016 Reason for consult: Follow-up assessment   Follow up with mom of 33 hour old infant. Infant with 11 BF for 10-30 minutes, 3 voids and 3 stools in 24 hours preceding this assessment. LATCH scores 6-8. Infant weight 7 lb 0.4 oz with weight loss of 4% since birth.   Mom reports she feels BF is going better. She reports she is feeling a little fuller today. Mom reports some nipple tenderness with initial latch that improves with feeding. Enc her to apply EBM to nipples post BF. Mom reports infant just finished feeding. Mom had some EBM in a manual pump at bedside that she pumped prior to BF, showed her how to spoon feed EBM to infant via spoon, he tolerated it well. He then wanted to relatch, mom relatched him independently to the left breast in the football hold, he was still feeding when I left the room.   Enc mom to continue to feed 8-12 x in 24 hours at first feeding cues. Enc mom to massage/ compress breast with feeding. Mom has a manual pump for home use. [facility] and plans to call and make a f/u appt. Infant with f/u Ped appt tomorrow. Reviewed all BF information in Taking Care of Baby and Me Booklet, reviewed engorgement prevention/treatment, I/O, positioning, BF Basics, and Breast milk storage and handling. Reviewed LC Brochure, enc mom to call for questions/concerns prn. Mom is aware of OP Services, BF Support Groups and LC phone #.    Maternal Data Formula Feeding for Exclusion: No Has patient been taught Hand Expression?: Yes Does the patient have breastfeeding experience prior to this delivery?: Yes  Feeding Feeding Type: Breast Fed Length of feed:  (Still feeding when I left the room)  LATCH Score/Interventions Latch: Grasps breast easily, tongue down, lips flanged, rhythmical sucking.  Audible Swallowing: A few with  stimulation Intervention(s): Skin to skin;Hand expression;Alternate breast massage  Type of Nipple: Everted at rest and after stimulation  Comfort (Breast/Nipple): Filling, red/small blisters or bruises, mild/mod discomfort  Problem noted: Mild/Moderate discomfort Interventions (Mild/moderate discomfort):  (with initial latch, Enc to use EBM to nipples post BF)  Hold (Positioning): No assistance needed to correctly position infant at breast. Intervention(s): Breastfeeding basics reviewed;Support Pillows;Position options;Skin to skin  LATCH Score: 8  Lactation Tools Discussed/Used WIC Program: Yes   Consult Status Consult Status: Complete Date: 07/28/16 Follow-up type: Call as needed    Samantha Lamb 07/27/2016, 11:19 AM

## 2016-07-27 NOTE — Discharge Instructions (Signed)
Storing Breast Milk Breast milk is a living fluid that contains infection-fighting cells (antibodies). Pre-pumped (expressed) breast milk needs to be stored in a certain way so that it remains effective in protecting your baby against infections. The following guidelines are for storing breast milk for a healthy, full-term infant. How long can breast milk be stored?  Milk can be stored for up to 4 hours at room temperature, or 60-65F (15.6-19.4C). However, it is acceptable to allow the milk to sit for 6-8 hours if the pump parts and containers are well cleaned.  Milk can be stored for 3 days in a refrigerator at less than 59F (3.9C). However, it is acceptable to allow the milk to sit for up to 8 days if the pump parts and containers are well cleaned.  Milk can be stored for 2 weeks in a freezer compartment inside a refrigerator.  Milk can be stored for 3-6 months in a freezer unit with a separate door.  Milk can be stored for 6-12 months in a deep freezer at -108F (-20C). A deep freezer is a chest or stand-alone freezer that is not opened very often and is kept at a colder temperature than a regular freezer. How should I store breast milk?  Milk may be stored in:  A glass container.  A hard, BPA-free plastic container.  A plastic bag specially designed for storing breast milk. Many women like these because they take up less space than other containers and can be attached directly to the breast pump.  Store your milk in 2-4 oz. (60-120 mL) servings. This makes it easier to thaw the milk. It also helps you avoid having to throw out milk that your baby does not drink.  Leave an inch or so at the top of the bag or bottle so the milk has room to expand as it freezes.  Label each container with the date and time the milk was pumped so that you use the milk in the order that it was pumped.  If you will be freezing the milk, freeze it within 24 hours of pumping. Store it in the back of the  freezer to prevent the milk from being affected by temperature changes when the freezer door is opened. How do I thaw frozen breast milk?  Frozen milk can be thawed:  In a refrigerator.  Under warm, running tap water.  In a pan of warm water that has been heated on the stove.  Do not heat milk directly on the stove or in the microwave. The milk will heat unevenly, and it may burn the baby. It will also destroy some of the milks infection-fighting properties.  Thawed milk can be stored in the refrigerator for up to 24 hours, but it should not be refrozen.  If you want to freeze freshly pumped milk along with milk that is already frozen, take these steps to prevent the fresh milk from thawing out the frozen milk:  Chill the fresh milk in the refrigerator for 30 minutes before adding it to the frozen milk.  Make sure that the amount of fresh milk you add is smaller than the amount of frozen milk. This information is not intended to replace advice given to you by your health care provider. Make sure you discuss any questions you have with your health care provider. Document Released: 05/23/2009 Document Revised: 03/25/2016 Document Reviewed: 02/25/2016 Elsevier Interactive Patient Education  2017 [employer] Postpartum Care After Vaginal Delivery The period of time right after  you deliver your newborn is called the postpartum period. What kind of medical care will I receive?  You may continue to receive fluids and medicines through an IV tube inserted into one of your veins.  If an incision was made near your vagina (episiotomy) or if you had some vaginal tearing during delivery, cold compresses may be placed on your episiotomy or your tear. This helps to reduce pain and swelling.  You may be given a squirt bottle to use when you go to the bathroom. You may use this until you are comfortable wiping as usual. To use the squirt bottle, follow these steps:  Before you urinate, fill the  squirt bottle with warm water. Do not use hot water.  After you urinate, while you are sitting on the toilet, use the squirt bottle to rinse the area around your urethra and vaginal opening. This rinses away any urine and blood.  You may do this instead of wiping. As you start healing, you may use the squirt bottle before wiping yourself. Make sure to wipe gently.  Fill the squirt bottle with clean water every time you use the bathroom.  You will be given sanitary pads to wear. How can I expect to feel?  You may not feel the need to urinate for several hours after delivery.  You will have some soreness and pain in your abdomen and vagina.  If you are breastfeeding, you may have uterine contractions every time you breastfeed for up to several weeks postpartum. Uterine contractions help your uterus return to its normal size.  It is normal to have vaginal bleeding (lochia) after delivery. The amount and appearance of lochia is often similar to a menstrual period in the first week after delivery. It will gradually decrease over the next few weeks to a dry, yellow-brown discharge. For most women, lochia stops completely by 6-8 weeks after delivery. Vaginal bleeding can vary from woman to woman.  Within the first few days after delivery, you may have breast engorgement. This is when your breasts feel heavy, full, and uncomfortable. Your breasts may also throb and feel hard, tightly stretched, warm, and tender. After this occurs, you may have milk leaking from your breasts.Your health care provider can help you relieve discomfort due to breast engorgement. Breast engorgement should go away within a few days.  You may feel more sad or worried than normal due to hormonal changes after delivery. These feelings should not last more than a few days. If these feelings do not go away after several days, speak with your health care provider. How should I care for myself?  Tell your health care provider if  you have pain or discomfort.  Drink enough water to keep your urine clear or pale yellow.  Wash your hands thoroughly with soap and water for at least 20 seconds after changing your sanitary pads, after using the toilet, and before holding or feeding your baby.  If you are not breastfeeding, avoid touching your breasts a lot. Doing this can make your breasts produce more milk.  If you become weak or lightheaded, or you feel like you might faint, ask for help before:  Getting out of bed.  Showering.  Change your sanitary pads frequently. Watch for any changes in your flow, such as a sudden increase in volume, a change in color, the passing of large blood clots. If you pass a blood clot from your vagina, save it to show to your health care provider. Do not flush  blood clots down the toilet without having your health care provider look at them.  Make sure that all your vaccinations are up to date. This can help protect you and your baby from getting certain diseases. You may need to have immunizations done before you leave the hospital.  If desired, talk with your health care provider about methods of family planning or birth control (contraception). How can I start bonding with my baby? Spending as much time as possible with your baby is very important. During this time, you and your baby can get to know each other and develop a bond. Having your baby stay with you in your room (rooming in) can give you time to get to know your baby. Rooming in can also help you become comfortable caring for your baby. Breastfeeding can also help you bond with your baby. How can I plan for returning home with my baby?  Make sure that you have a car seat installed in your vehicle.  Your car seat should be checked by a certified car seat installer to make sure that it is installed safely.  Make sure that your baby fits into the car seat safely.  Ask your health care provider any questions you have about  caring for yourself or your baby. Make sure that you are able to contact your health care provider with any questions after leaving the hospital. This information is not intended to replace advice given to you by your health care provider. Make sure you discuss any questions you have with your health care provider. Document Released: 05/23/2007 Document Revised: 12/29/2015 Document Reviewed: 06/30/2015 Elsevier Interactive Patient Education  2017 [employer]

## 2016-07-27 NOTE — Lactation Note (Signed)
This note was copied from a baby's chart. Lactation Consultation Note Mom frustrated d/t baby cluster feeding, mom is tired and feels that baby isn't getting enough. Hand expressed colostrum. Mom happy about that. Encouraged to feel breast before and after BF to assess for transfer of colostrum. Baby had 7 stools and 3 voids at 26 hrs of age.  Mom BF in cradle position when entered rm. Assisted in football position. Mom loved that position. Baby appeared to like it looking around staring at mom. Taught "C" hold for latching and using positioners under hand that supports baby's head.  Mom understand good English for communication.  Mom states she has some relief.  Patient Name: Samantha Lamb [id number]'s Date: 07/27/2016 Reason for consult: Follow-up assessment   Maternal Data    Feeding Feeding Type: Breast Fed Length of feed: 30 min  LATCH Score/Interventions Latch: Grasps breast easily, tongue down, lips flanged, rhythmical sucking.  Audible Swallowing: None Intervention(s): Skin to skin;Hand expression Intervention(s): Alternate breast massage  Type of Nipple: Everted at rest and after stimulation  Comfort (Breast/Nipple): Filling, red/small blisters or bruises, mild/mod discomfort  Problem noted: Mild/Moderate discomfort Interventions (Mild/moderate discomfort): Hand massage;Hand expression;Reverse pressue  Hold (Positioning): Assistance needed to correctly position infant at breast and maintain latch. Intervention(s): Breastfeeding basics reviewed;Support Pillows;Position options;Skin to skin  LATCH Score: 6  Lactation Tools Discussed/Used     Consult Status Consult Status: Follow-up Date: 07/28/16 Follow-up type: In-patient    Madolin Twaddle, Diamond NickelLAURA G 07/27/2016, 4:01 AM

## 2016-07-29 ENCOUNTER — Encounter (HOSPITAL_COMMUNITY): Payer: Self-pay

## 2016-07-29 ENCOUNTER — Inpatient Hospital Stay (HOSPITAL_COMMUNITY)
Admission: AD | Admit: 2016-07-29 | Discharge: 2016-07-29 | Disposition: A | Discharge status: Home / Self Care | Payer: Medicaid Other | Source: Ambulatory Visit | Attending: Obstetrics & Gynecology | Admitting: Obstetrics & Gynecology

## 2016-07-29 ENCOUNTER — Telehealth: Payer: Self-pay | Admitting: *Deleted

## 2016-07-29 DIAGNOSIS — N6320 Unspecified lump in the left breast, unspecified quadrant: Secondary | ICD-10-CM | POA: Insufficient documentation

## 2016-07-29 DIAGNOSIS — N6322 Unspecified lump in the left breast, upper inner quadrant: Secondary | ICD-10-CM

## 2016-07-29 DIAGNOSIS — N63 Unspecified lump in unspecified breast: Secondary | ICD-10-CM

## 2016-07-29 DIAGNOSIS — Z880 Allergy status to penicillin: Secondary | ICD-10-CM | POA: Insufficient documentation

## 2016-07-29 NOTE — Telephone Encounter (Signed)
Called pt and discussed her concern regarding the lump in her breast.  She stated that she came to [facility] today for evaluation and will be scheduled for mammogram. She had no further questions.

## 2016-07-29 NOTE — Telephone Encounter (Signed)
Patient left a message on the nurse voicemail on 07/29/16 at 0904.  States she delivered on 07/26/16 and is breastfeeding.  States she feels a lump in one breast that is painful.  Wants to know if she needs to schedule an appointment.  Requests a return call.

## 2016-07-29 NOTE — [facility]U Note (Signed)
Pt is PP delivered on 12/18. Reports a lump in her upper left breast. Not painful or red

## 2016-07-29 NOTE — MAU Provider Note (Signed)
History     CSN: [id number]  Arrival date and time: 07/29/16 1256   First Provider Initiated Contact with Patient 07/29/16 1338      Chief Complaint  Patient presents with  . Breast Problem   Postpartum female s/p SVD 3 days ago here with left breast lump. She reports first noticing it sometime during her pregnancy but then yesterday it became more prominent and painful with palpation. She denies redness or edema. She is exclusively breastfeeding and pumping. She denies any interruptions in feeding schedule. She denies fever, chills, malaise.     OB History    Gravida Para Term Preterm AB Living   4 3 2 1  0 4   SAB TAB Ectopic Multiple Live Births   0 0 0 1 4      Past Medical History:  Diagnosis Date  . Gestational diabetes    glyburide at night  . Hyperlipemia   . Medical history non-contributory   . Pregnant     Past Surgical History:  Procedure Laterality Date  . NO PAST SURGERIES      Family History  Problem Relation Age of Onset  . Diabetes Mother   . Diabetes Father     Social History  Substance Use Topics  . Smoking status: Never Smoker  . Smokeless tobacco: Never Used  . Alcohol use No    Allergies:  Allergies  Allergen Reactions  . Penicillins Hives, Itching and Other (See Comments)    Has patient had a PCN reaction causing immediate rash, facial/tongue/throat swelling, SOB or lightheadedness with hypotension: Yes Has patient had a PCN reaction causing severe rash involving mucus membranes or skin necrosis: No Has patient had a PCN reaction that required hospitalization No Has patient had a PCN reaction occurring within the last 10 years: No If all of the above answers are "NO", then may proceed with Cephalosporin use.    Prescriptions Prior to Admission  Medication Sig Dispense Refill Last Dose  . ibuprofen (ADVIL,MOTRIN) 600 MG tablet Take 1 tablet (600 mg total) by mouth every 6 (six) hours as needed. 30 tablet 0   . prenatal vitamin  w/FE, FA (PRENATAL 1 + 1) 27-1 MG TABS tablet Take 1 tablet by mouth at bedtime.   07/24/2016 at Unknown time    Review of Systems  Constitutional: Negative.   Gastrointestinal: Negative.    Physical Exam   Blood pressure 119/78, pulse 80, temperature 98.2 F (36.8 C), resp. rate 18, unknown if currently breastfeeding.  Physical Exam  Constitutional: She is oriented to person, place, and time. She appears well-developed and well-nourished. No distress.  HENT:  Head: Normocephalic and atraumatic.  Neck: Normal range of motion.  Cardiovascular: Normal rate.   Respiratory: Effort normal. Right breast exhibits no inverted nipple, no mass, no nipple discharge, no skin change and no tenderness. Left breast exhibits mass (2cm x 2cm oval firm mass at 10 o'clock with smooth edges ) and tenderness. Left breast exhibits no inverted nipple, no nipple discharge and no skin change. Breasts are symmetrical.    Musculoskeletal: Normal range of motion.  Neurological: She is alert and oriented to person, place, and time.  Skin: Skin is warm and dry.  Psychiatric: She has a normal mood and affect.    MAU Course  Procedures  MDM Likely blocked lactiferous duct but cannot exclude pathology based on clinical exam alone. Will send for imaging. Stable for discharge home.  Assessment and Plan   1. Breast mass in female  2. Lactating mother    Discharge home Continue to breastfeed Follow up at [facility] in 1 week for mammo  Allergies as of 07/29/2016      Reactions   Penicillins Hives, Itching, Other (See Comments)   Has patient had a PCN reaction causing immediate rash, facial/tongue/throat swelling, SOB or lightheadedness with hypotension: Yes Has patient had a PCN reaction causing severe rash involving mucus membranes or skin necrosis: No Has patient had a PCN reaction that required hospitalization No Has patient had a PCN reaction occurring within the last 10 years: No If all of  the above answers are "NO", then may proceed with Cephalosporin use.      Medication List    TAKE these medications   ibuprofen 600 MG tablet Commonly known as:  ADVIL,MOTRIN Take 1 tablet (600 mg total) by mouth every 6 (six) hours as needed.   prenatal vitamin w/FE, FA 27-1 MG Tabs tablet Take 1 tablet by mouth at bedtime.      Donette LarryMelanie Vyctoria Dickman, CNM 07/29/2016, 1:46 PM

## 2016-07-30 ENCOUNTER — Other Ambulatory Visit: Payer: Self-pay | Admitting: Certified Nurse Midwife

## 2016-07-30 DIAGNOSIS — N63 Unspecified lump in unspecified breast: Secondary | ICD-10-CM

## 2016-08-23 ENCOUNTER — Other Ambulatory Visit: Payer: Medicaid Other

## 2016-08-30 ENCOUNTER — Ambulatory Visit: Payer: Medicaid Other | Admitting: Advanced Practice Midwife

## 2016-09-15 ENCOUNTER — Emergency Department (HOSPITAL_COMMUNITY)
Admission: EM | Admit: 2016-09-15 | Discharge: 2016-09-15 | Disposition: A | Discharge status: Home / Self Care | Payer: Medicaid Other | Attending: Emergency Medicine | Admitting: Emergency Medicine

## 2016-09-15 ENCOUNTER — Encounter (HOSPITAL_COMMUNITY): Payer: Self-pay | Admitting: *Deleted

## 2016-09-15 DIAGNOSIS — J111 Influenza due to unidentified influenza virus with other respiratory manifestations: Secondary | ICD-10-CM | POA: Diagnosis not present

## 2016-09-15 DIAGNOSIS — R69 Illness, unspecified: Secondary | ICD-10-CM

## 2016-09-15 DIAGNOSIS — Z79899 Other long term (current) drug therapy: Secondary | ICD-10-CM | POA: Insufficient documentation

## 2016-09-15 DIAGNOSIS — R05 Cough: Secondary | ICD-10-CM | POA: Diagnosis present

## 2016-09-15 MED ORDER — OSELTAMIVIR PHOSPHATE 75 MG PO CAPS: 75.0000 mg | ORAL_CAPSULE | Freq: Two times a day (BID) | ORAL | 0 refills | Status: DC

## 2016-09-15 NOTE — ED Provider Notes (Signed)
[facility]-EMERGENCY DEPT Provider Note   CSN: [id number] Arrival date & time: 09/15/16  1000  By signing my name below, I, Freida Busman, attest that this documentation has been prepared under the direction and in the presence of Arthor Captain, PA-C. Electronically Signed: Freida Busman, Scribe. 09/15/2016. 1:08 PM.  History   Chief Complaint Chief Complaint  Patient presents with  . Cough   The history is provided by the patient. No language interpreter was used.    HPI Comments:  Samantha Lamb is a 36 y.o. female who presents to the Emergency Department complaining of generalized body aches with associated diarrhea, dizziness, fatigue and subjective fever x 1 day. She also notes cough. No vomiting. Pt notes her kids had the flu 1 week ago.  She is currently breast feeding.  No alleviating factors noted.   Past Medical History:  Diagnosis Date  . Gestational diabetes    glyburide at night  . Hyperlipemia   . Medical history non-contributory   . Pregnant     Patient Active Problem List   Diagnosis Date Noted  . Labor and delivery, indication for care 07/25/2016  . Gestational diabetes 07/25/2016  . 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 Gestational diabetes mellitus, antepartum 04/19/2016  . History of prior pregnancy with SGA newborn 04/19/2016  . Supervision of high risk pregnancy in third trimester 04/15/2016  . Advanced maternal age in multigravida     Past Surgical History:  Procedure Laterality Date  . NO PAST SURGERIES      OB History    Gravida Para Term Preterm AB Living   4 3 2 1  0 4   SAB TAB Ectopic Multiple Live Births   0 0 0 1 4       Home Medications    Prior to Admission medications   Medication Sig Start Date End Date Taking? Authorizing Provider  ibuprofen (ADVIL,MOTRIN) 600 MG tablet Take 1 tablet (600 mg total) by mouth every 6 (six) hours as needed. 07/27/16   Arabella Merles, CNM  prenatal vitamin w/FE, FA (PRENATAL 1 + 1) 27-1 MG TABS tablet Take  1 tablet by mouth at bedtime.    Historical Provider, MD    Family History Family History  Problem Relation Age of Onset  . Diabetes Mother   . Diabetes Father     Social History Social History  Substance Use Topics  . Smoking status: Never Smoker  . Smokeless tobacco: Never Used  . Alcohol use No     Allergies   Penicillins   Review of Systems Review of Systems  Constitutional: Positive for fatigue and fever.  HENT: Positive for congestion.   Respiratory: Positive for cough.   Gastrointestinal: Positive for diarrhea. Negative for vomiting.  Musculoskeletal: Positive for myalgias.  Neurological: Positive for dizziness.    Physical Exam Updated Vital Signs BP 137/73 (BP Location: Left Arm)   Pulse 77   Temp 98.7 F (37.1 C) (Oral)   Resp 18   Ht 5\' 3"  (1.6 m)   Wt 165 lb (74.8 kg)   SpO2 96%   BMI 29.23 kg/m   Physical Exam  Constitutional: She is oriented to person, place, and time. She appears well-developed and well-nourished. No distress.  HENT:  Head: Normocephalic.  Eyes: Conjunctivae are normal.  Neck: Normal range of motion. Neck supple.  Cardiovascular: Normal rate.   Pulmonary/Chest: Effort normal and breath sounds normal. No respiratory distress. She has no wheezes. She has no rales.  Abdominal: She exhibits no  distension.  Musculoskeletal: Normal range of motion.  Neurological: She is alert and oriented to person, place, and time.  Skin: Skin is warm and dry.  Psychiatric: She has a normal mood and affect.  Nursing note and vitals reviewed.    ED Treatments / Results  DIAGNOSTIC STUDIES:  Oxygen Saturation is 96% on RA, normal by my interpretation.    COORDINATION OF CARE:  1:00 PM Discussed treatment plan with pt at bedside and pt agreed to plan.  Labs (all labs ordered are listed, but only abnormal results are displayed) Labs Reviewed - No data to display  EKG  EKG Interpretation None       Radiology No results  found.  Procedures Procedures (including critical care time)  Medications Ordered in ED Medications - No data to display   Initial Impression / Assessment and Plan / ED Course  I have reviewed the triage vital signs and the nursing notes.  Pertinent labs & imaging results that were available during my care of the patient were reviewed by me and considered in my medical decision making (see chart for details).     DIscussed need for her to follow up with her infant's pediatrician/ Patient given CDC guidelines hand out regarding nursing with influenza.  Patient with symptoms consistent with influenza.  Vitals are stable, low-grade fever.  No signs of dehydration, tolerating PO's.  Lungs are clear. Due to patient's presentation and physical exam a chest x-ray was not ordered bc likely diagnosis of flu.  Will discharge with Tamiflu per CDC recommendation.  Patient will be discharged with instructions to orally hydrate, rest, and use over-the-counter medications such as anti-inflammatories ibuprofen and Aleve for muscle aches and Tylenol for fever.    Final Clinical Impressions(s) / ED Diagnoses   Final diagnoses:  Influenza-like illness    New Prescriptions New Prescriptions   No medications on file   I personally performed the services described in this documentation, which was scribed in my presence. The recorded information has been reviewed and is accurate.       Arthor CaptainAbigail Donn Wilmot, PA-C 09/20/16 0123    Arthor CaptainAbigail Urania Pearlman, PA-C 09/20/16 0124    Shaune Pollackameron Isaacs, MD 09/20/16 [phone]

## 2016-09-15 NOTE — ED Notes (Signed)
Papers reviewed with patient with PA present. Patient denies questions and verbalizes understanding. Telemedicine card given and resources on breastfeeding with the flu given

## 2016-09-15 NOTE — ED Notes (Signed)
Pt. sts that her children have been sick with the flu and she has had diarrhea. Has been taking tylenol that has helped with the fever and body aches

## 2016-09-15 NOTE — ED Triage Notes (Signed)
Pt states that her children have had both strains of the flu recently. Pt reports cough, congestion, chills, diarrhea. Pt is also breastfeeding.

## 2016-09-15 NOTE — Discharge Instructions (Addendum)

## 2016-09-15 NOTE — Discharge Planning (Signed)
Pt up for discharge. EDCM reviewed chart for possible CM needs.  No needs identified or communicated.  

## 2016-09-17 ENCOUNTER — Ambulatory Visit: Payer: Medicaid Other | Admitting: Obstetrics and Gynecology

## 2016-09-27 ENCOUNTER — Encounter: Payer: Self-pay | Admitting: Obstetrics and Gynecology

## 2016-09-27 ENCOUNTER — Ambulatory Visit (INDEPENDENT_AMBULATORY_CARE_PROVIDER_SITE_OTHER): Payer: Medicaid Other | Admitting: Obstetrics and Gynecology

## 2016-09-27 DIAGNOSIS — Z3041 Encounter for surveillance of contraceptive pills: Secondary | ICD-10-CM | POA: Insufficient documentation

## 2016-09-27 MED ORDER — NORETHINDRONE 0.35 MG PO TABS: 1.0000 | ORAL_TABLET | Freq: Every day | ORAL | 11 refills | Status: AC

## 2016-09-27 NOTE — Progress Notes (Signed)
Subjective:     Samantha Lamb is a 36 y.o. female who presents for a postpartum visit. She is 4 weeks postpartum following a 07/26/2016. I have fully reviewed the prenatal and intrapartum course. The delivery was at 39 gestational weeks. Outcome: vaginal. Anesthesia: Epdiual. Postpartum course has been uncomplicated. Baby's course has been uncomplicated. Baby is feeding by breast. Bleeding not at this time. Bowel function is normal. Bladder function is normal. Patient is sexually active. Contraception method is nothing at this time, but desires birth control pills. Postpartum depression screening: negative  The following portions of the patient's history were reviewed and updated as appropriate: allergies, current medications, past family history, past medical history, past social history, past surgical history and problem list.  Review of Systems Pertinent items are noted in HPI. Pertinent items noted in HPI and remainder of comprehensive ROS otherwise negative.   Objective:    BP 105/74   Pulse 80   Wt 159 lb 8 oz (72.3 kg)   BMI 28.25 kg/m   General:  alert, cooperative and appears stated age   Breasts:  Deferred   Lungs: clear to auscultation bilaterally  Heart:  regular rate and rhythm, S1, S2 normal, no murmur, click, rub or gallop  Abdomen: soft, non-tender; bowel sounds normal; no masses,  no organomegaly   Vulva:  normal  Vagina: normal vagina  Cervix:  Not evaluated   Corpus: not examined  Adnexa:  not evaluated  Rectal Exam: Not performed.     Assessment:   Normal postpartum exam. Pap smear not done at today's visit. Last Pap done on 03/29/16 and it was normal.   Plan:   1. Contraception: oral progesterone-only contraceptive 2. Return in 1-2 weeks fasting for GTT postpartum, patient to return fasting.  3. Follow up in: 1-2 weeks   Duane LopeJennifer I Rasch, NP 09/27/2016

## 2016-09-27 NOTE — Patient Instructions (Signed)
Schedule an appointment to come back for GTT postpartum.     Breastfeeding Challenges and Solutions Even though breastfeeding is natural, it can be challenging, especially in the first few weeks after childbirth. It is normal for problems to arise when starting to breastfeed your new baby, even if you have breastfed before. This document provides some solutions to the most common breastfeeding challenges. Challenges and solutions Challenge-Cracked or Sore Nipples  Cracked or sore nipples are commonly experienced by breastfeeding mothers. Cracked or sore nipples often are caused by inadequate latching (when your baby's mouth attaches to your breast to breastfeed). Soreness can also happen if your baby is not positioned properly at your breast. Although nipple cracking and soreness are common during the first week after birth, nipple pain is never normal. If you experience nipple cracking or soreness that lasts longer than 1 week or nipple pain, call your health care provider or lactation consultant. Solution  Ensure proper latching and positioning of your baby by following the steps below:  Find a comfortable place to sit or lie down, with your neck and back well supported.  Place a pillow or rolled up blanket under your baby to bring him or her to the level of your breast (if you are seated).  Make sure that your baby's abdomen is facing your abdomen.  Gently massage your breast. With your fingertips, massage from your chest wall toward your nipple in a circular motion. This encourages milk flow. You may need to continue this action during the feeding if your milk flows slowly.  Support your breast with 4 fingers underneath and your thumb above your nipple. Make sure your fingers are well away from your nipple and your baby's mouth.  Stroke your baby's lips gently with your finger or nipple.  When your baby's mouth is open wide enough, quickly bring your baby to your breast, placing your  entire nipple and as much of the colored area around your nipple (areola) as possible into your baby's mouth.  More areola should be visible above your baby's upper lip than below the lower lip.  Your baby's tongue should be between his or her lower gum and your breast.  Ensure that your baby's mouth is correctly positioned around your nipple (latched). Your baby's lips should create a seal on your breast and be turned out (everted).  It is common for your baby to suck for about 2-3 minutes in order to start the flow of breast milk. Signs that your baby has successfully latched on to your nipple include:  Quietly tugging or quietly sucking without causing you pain.  Swallowing heard between every 3-4 sucks.  Muscle movement above and in front of his or her ears with sucking. Signs that your baby has not successfully latched on to nipple include:  Sucking sounds or smacking sounds from your baby while nursing.  Nipple pain. Ensure that your breasts stay moisturized and healthy by:  Avoiding the use of soap on your nipples.  Wearing a supportive bra. Avoid wearing underwire-style bras or tight bras.  Air drying your nipples for 3-4 minutes after each feeding.  Using only cotton bra pads to absorb breast milk leakage. Leaking of breast milk between feedings is normal. Be sure to change the pads if they become soaked with milk.  Using lanolin on your nipples after nursing. Lanolin helps to maintain your skin's normal moisture barrier. If you use pure lanolin you do not need to wash it off before feeding your baby  again. Pure lanolin is not toxic to your baby. You may also hand express a few drops of breast milk and gently massage that milk into your nipples, allowing it to air dry. Challenge-Breast Engorgement  Breast engorgement is the overfilling of your breasts with breast milk. In the first few weeks after giving birth, you may experience breast engorgement. Breast engorgement can  make your breasts throb and feel hard, tightly stretched, warm, and tender. Engorgement peaks about the fifth day after you give birth. Having breast engorgement does not mean you have to stop breastfeeding your baby. Solution  Breastfeed when you feel the need to reduce the fullness of your breasts or when your baby shows signs of hunger. This is called "breastfeeding on demand."  Newborns (babies younger than 4 weeks) often breastfeed every 1-3 hours during the day. You may need to awaken your baby to feed if he or she is asleep at a feeding time.  Do not allow your baby to sleep longer than 5 hours during the night without a feeding.  Pump or hand express breast milk before breastfeeding to soften your breast, areola, and nipple.  Apply warm, moist heat (in the shower or with warm water-soaked hand towels) just before feeding or pumping, or massage your breast before or during breastfeeding. This increases circulation and helps your milk to flow.  Completely empty your breasts when breastfeeding or pumping. Afterward, wear a snug bra (nursing or regular) or tank top for 1-2 days to signal your body to slightly decrease milk production. Only wear snug bras or tank tops to treat engorgement. Tight bras typically should be avoided by breastfeeding mothers. Once engorgement is relieved, return to wearing regular, loose-fitting clothes.  Apply ice packs to your breasts to lessen the pain from engorgement and relieve swelling, unless the ice is uncomfortable for you.  Do not delay feedings. Try to relax when it is time to feed your baby. This helps to trigger your "let-down reflex," which releases milk from your breast.  Ensure your baby is latched on to your breast and positioned properly while breastfeeding.  Allow your baby to remain at your breast as long as he or she is latched on well and actively sucking. Your baby will let you know when he or she is done breastfeeding by pulling away from  your breast or falling asleep.  Avoid introducing bottles or pacifiers to your baby in the early weeks of breastfeeding. Wait to introduce these things until after resolving any breastfeeding challenges.  Try to pump your milk on the same schedule as when your baby would breastfeed if you are returning to work or away from home for an extended period.  Drink plenty of fluids to avoid dehydration, which can eventually put you at greater risk of breast engorgement. If you follow these suggestions, your engorgement should improve in 24-48 hours. If you are still experiencing difficulty, call your lactation consultant or health care provider. Challenge-Plugged Milk Ducts  Plugged milk ducts occur when the duct does not drain milk effectively and becomes swollen. Wearing a tight-fitting nursing bra or having difficulty with latching may cause plugged milk ducts. Not drinking enough water (8-10 c [1.9-2.4 L] per day) can contribute to plugged milk ducts. Once a duct has become plugged, hard lumps, soreness, and redness may develop in your breast. Solution  Do not delay feedings. Feed your baby frequently and try to empty your breasts of milk at each feeding. Try breastfeeding from the affected side first  so there is a better chance that the milk will drain completely from that breast. Apply warm, moist towels to your breasts for 5-10 minutes before feeding. Alternatively, a hot shower right before breastfeeding can provide the moist heat that can encourage milk flow. Gentle massage of the sore area before and during a feeding may also help. Avoid wearing tight clothing or bras that put pressure on your breasts. Wear bras that offer good support to your breasts, but avoid underwire bras. If you have a plugged milk duct and develop a fever, you need to see your health care provider. Challenge-Mastitis  Mastitis is inflammation of your breast. It usually is caused by a bacterial infection and can cause flu-like  symptoms. You may develop redness in your breast and a fever. Often when mastitis occurs, your breast becomes firm, warm, and very painful. The most common causes of mastitis are poor latching, ineffective sucking from your baby, consistent pressure on your breast (possibly from wearing a tight-fitting bra or shirt that restricts the milk flow), unusual stress or fatigue, or missed feedings. Solution  You will be given antibiotic medicine to treat the infection. It is still important to breastfeed frequently to empty your breasts. Continuing to breastfeed while you recover from mastitis will not harm your baby. Make sure your baby is positioned properly during every feeding. Apply moist heat to your breasts for a few minutes before feeding to help the milk flow and to help your breasts empty more easily. Challenge-Thrush  Samantha Lamb is a yeast infection that can form on your nipples, in your breast, or in your baby's mouth. It causes itching, soreness, burning or stabbing pain, and sometimes a rash. Solution  You will be given a medicated ointment for your nipples, and your baby will be given a liquid medicine for his or her mouth. It is important that you and your baby are treated at the same time because thrush can be passed between you and your baby. Change disposable nursing pads often. Any bras, towels, or clothing that come in contact with infected areas of your body or your baby's body need to be washed in very hot water every day. Wash your hands and your baby's hands often. All pacifiers, bottle nipples, or toys your baby puts in his or her mouth should be boiled once a day for 20 minutes. After 1 week of treatment, discard pacifiers and bottle nipples and buy new ones. All breast pump parts that touch the milk need to be boiled for 20 minutes every day. Challenge-Low Milk Supply  You may not be producing enough milk if your baby is not gaining the proper amount of weight. Breast milk production is  based on a supply-and-demand system. Your milk supply depends on how frequently and effectively your baby empties your breast. Solution  The more you breastfeed and pump, the more breast milk you will produce. It is important that your baby empties at least one of your breasts at each feeding. If this is not happening, then use a breast pump or hand express any milk that remains. This will help to drain as much milk as possible at each feeding. It will also signal your body to produce more milk. If your baby is not emptying your breasts, it may be due to latching, sucking, or positioning problems. If low milk supply continues after addressing these issues, contact your health care provider or a lactation specialist as soon as possible. Challenge-Inverted or Flat Nipples  Some women have  nipples that turn inward instead of protruding outward. Other women have nipples that are flat. Inverted or flat nipples can sometimes make it more difficult for your baby to latch onto your breast. Solution  You may be given a small device that pulls out inverted nipples. This device should be applied right before your baby is brought to your breast. You can also try using a breast pump for a short time before placing the baby at your breast. The pump can pull your nipple outwards to help your infant latch more easily. The baby's sucking motion will help the inverted nipple protrude as well. If you have flat nipples, encourage your baby to latch onto your breast and feed frequently in the early days after birth. This will give your baby practice latching on correctly while your breast is still soft. When your milk supply increases, between the second and fifth day after birth and your breasts become full, your baby will have an easier time latching. Contact a lactation consultant if you still have concerns. She or he can teach you additional techniques to address breastfeeding problems related to nipple shape and  position. Where to find more information: [employer]: www.llli.org This information is not intended to replace advice given to you by your health care provider. Make sure you discuss any questions you have with your health care provider. Document Released: 01/17/2006 Document Revised: 01/07/2016 Document Reviewed: 01/19/2013 Elsevier Interactive Patient Education  2017 [employer]

## 2016-10-01 ENCOUNTER — Other Ambulatory Visit: Payer: Medicaid Other

## 2016-10-01 DIAGNOSIS — O24419 Gestational diabetes mellitus in pregnancy, unspecified control: Secondary | ICD-10-CM

## 2016-10-02 LAB — GLUCOSE TOLERANCE, 2 HOURS
GLUCOSE FASTING GTT: 96 mg/dL (ref 65–99)
Glucose, 2 hour: 100 mg/dL (ref 65–139)

## 2016-10-12 ENCOUNTER — Telehealth: Payer: Medicaid Other | Admitting: Family

## 2016-10-12 DIAGNOSIS — R6889 Other general symptoms and signs: Secondary | ICD-10-CM

## 2016-10-12 MED ORDER — OSELTAMIVIR PHOSPHATE 75 MG PO CAPS: 75.0000 mg | ORAL_CAPSULE | Freq: Two times a day (BID) | ORAL | 0 refills | Status: AC

## 2016-10-12 NOTE — Progress Notes (Signed)
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
## 2018-11-22 IMAGING — US US MFM OB COMP +14 WKS
1 series · 13 of 28 positions shown · non-contrast
Comparison: none

[Series 1: us mfm ob comp +14 wks · 13 of 76 slices shown]
[im 3/76]
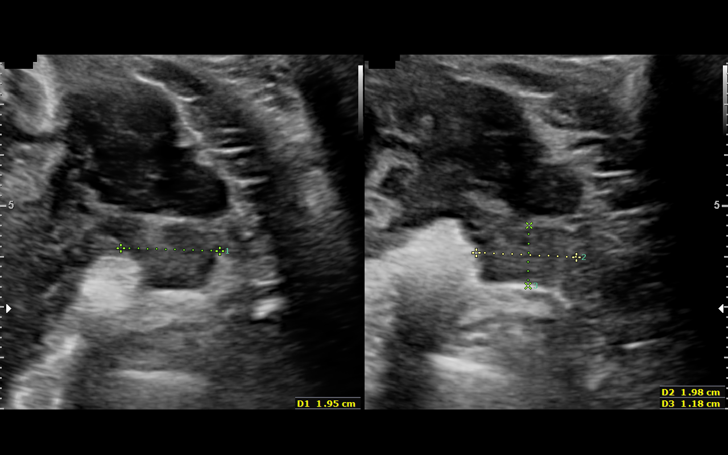
[im 9/76]
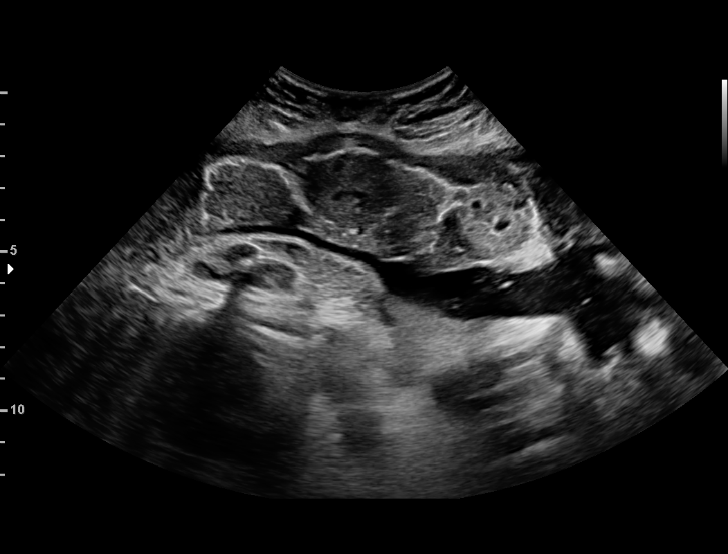
[im 14/76]
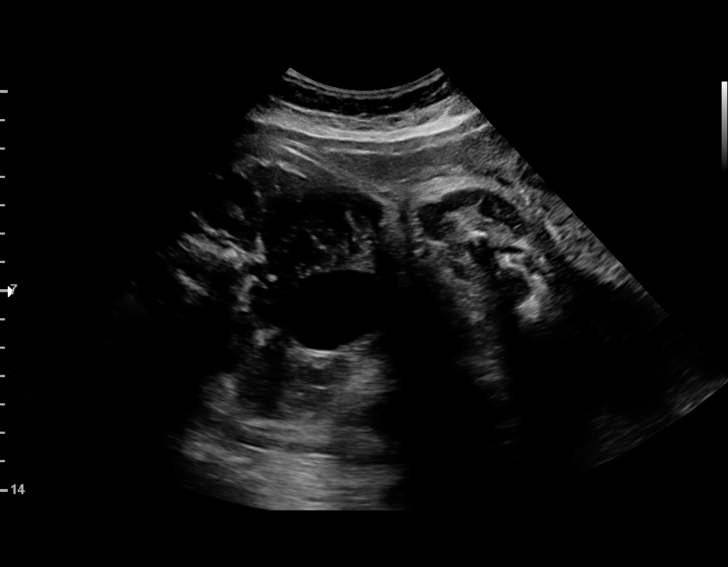
[im 20/76]
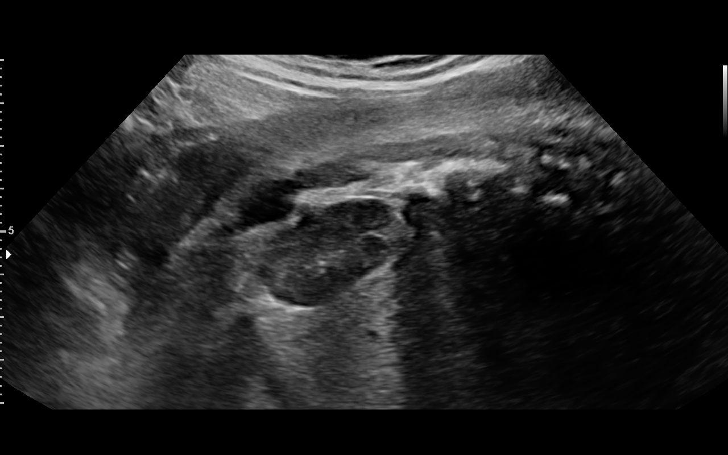
[im 26/76]
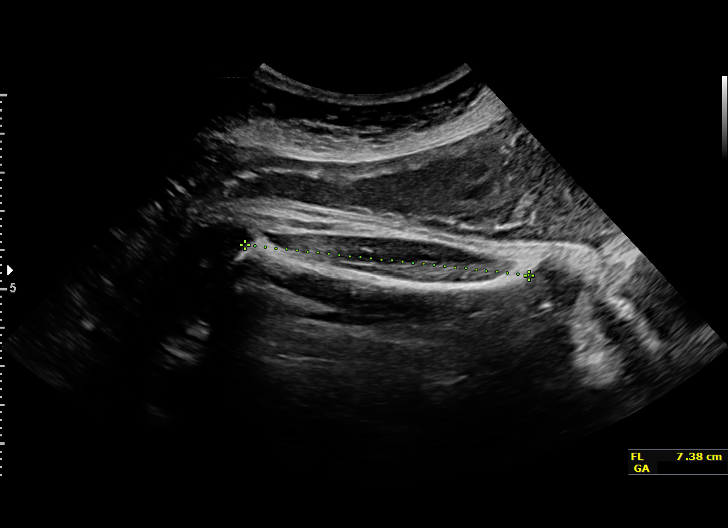
[im 31/76]
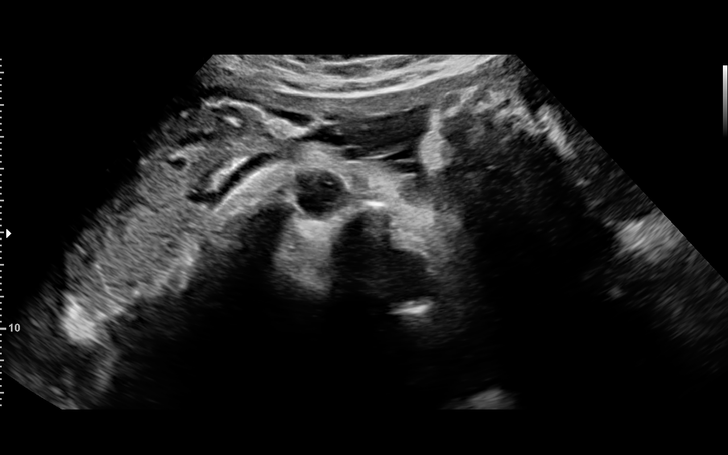
[im 39/76]
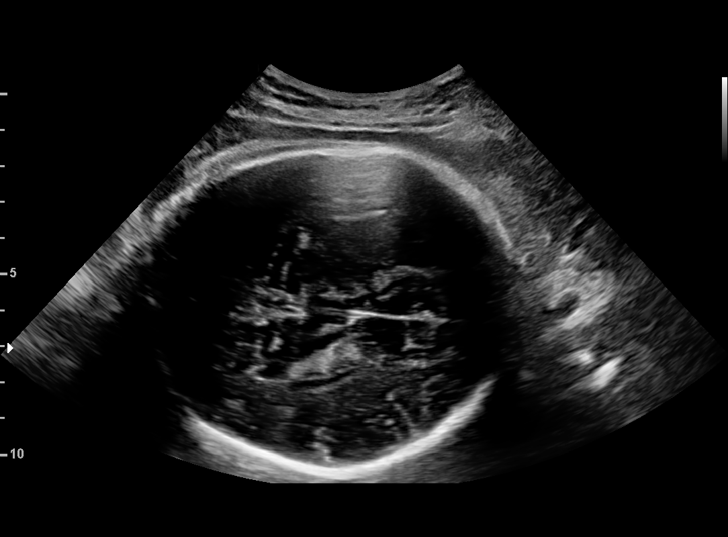
[im 45/76]
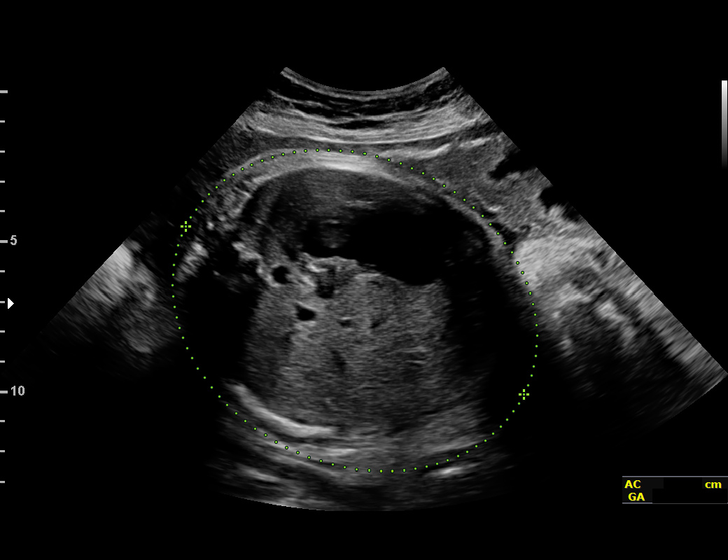
[im 51/76]
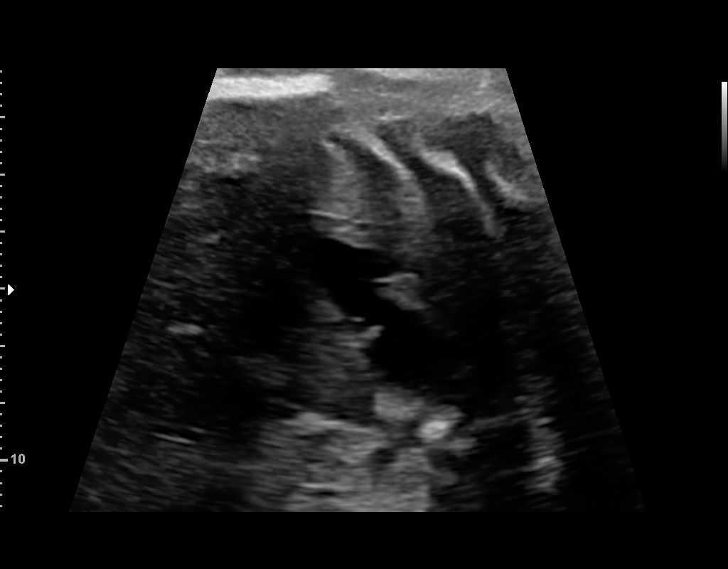
[im 56/76]
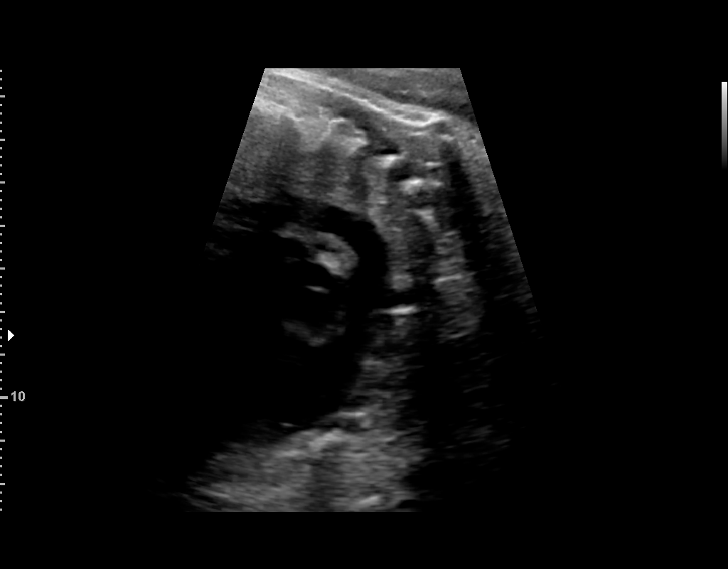
[im 62/76]
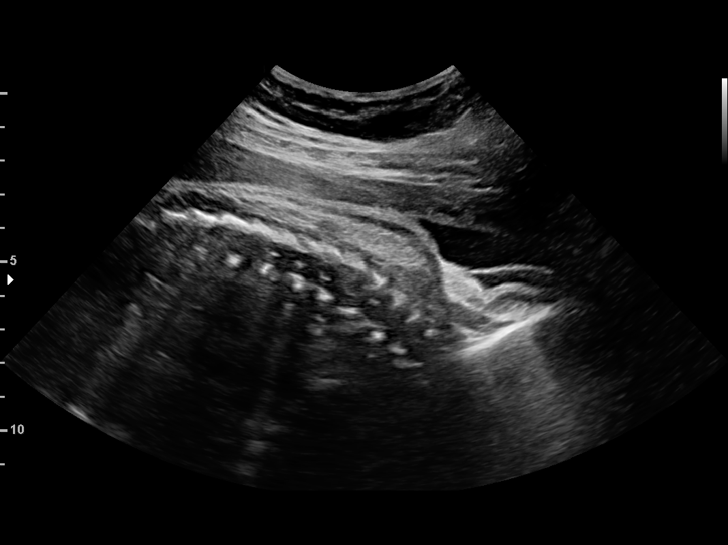
[im 67/76]
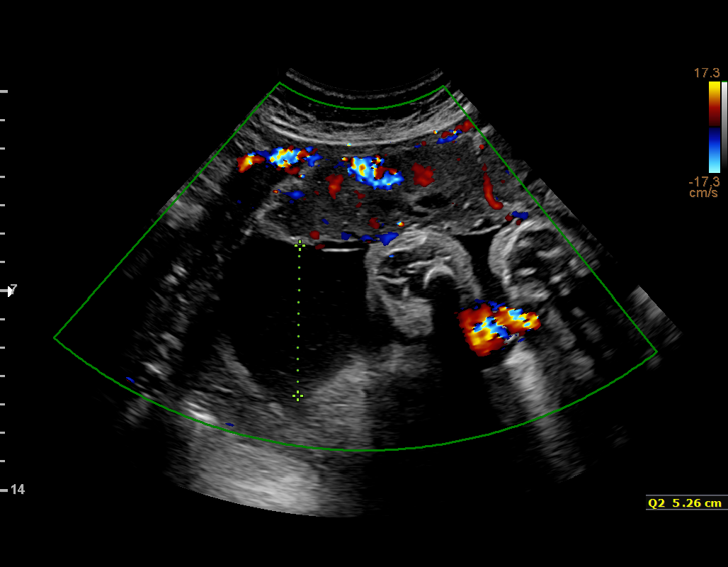
[im 73/76]
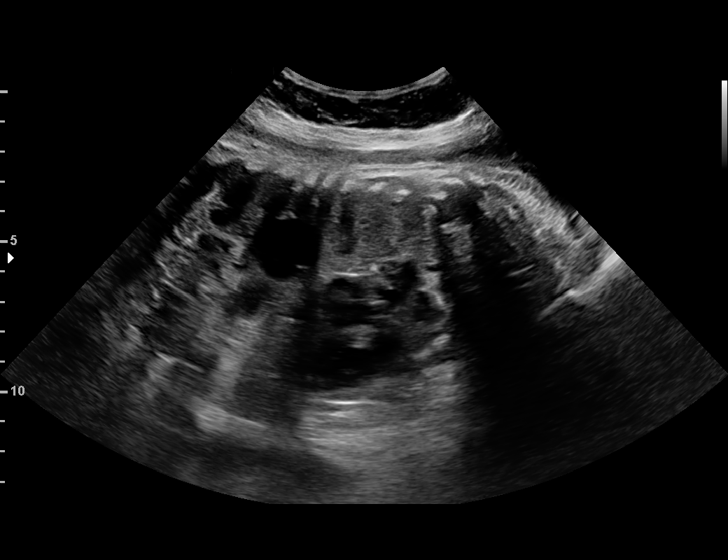

[13 of 28 positions shown; findings below may reference images not displayed]

pm)

[REDACTED]

Indications

38 weeks gestation of pregnancy
Advanced maternal age multigravida 35+,
third trimester (declined NIPS)
Gestational diabetes in pregnancy,
controlled by oral hypoglycemic drugs;
glyburide
Poor obstetric history: Previous preterm
delivery, antepartum; twins
Encounter for antenatal screening for
malformations
OB History

Gravidity:    3         Term:   1        Prem:   1        SAB:   0
TOP:          0       Ectopic:  0        Living: 3
Fetal Evaluation

Num Of Fetuses:     1
Fetal Heart         149
Rate(bpm):
Cardiac Activity:   Observed
Presentation:       Cephalic
Placenta:           Anterior, above cervical os
P. Cord Insertion:  Visualized, central

Amniotic Fluid
AFI FV:      Subjectively within normal limits

AFI Sum(cm)     %Tile       Largest Pocket(cm)
11.15           35
RUQ(cm)       RLQ(cm)       LUQ(cm)        LLQ(cm)
1.19
Biometry

BPD:      86.7  mm     G. Age:  35w 0d          5  %    CI:        69.35   %   70 - 86
FL/HC:      22.2   %   20.9 -
HC:      332.4  mm     G. Age:  38w 0d         26  %    HC/AC:      0.93       0.92 -
AC:       359   mm     G. Age:  39w 6d         95  %    FL/BPD:     85.2   %   71 - 87
FL:       73.9  mm     G. Age:  37w 6d         45  %    FL/AC:      20.6   %   20 - 24

Est. FW:    9553  gm    7 lb 12 oz      84  %
Gestational Age

U/S Today:     37w 5d                                        EDD:   08/04/16
Best:          38w 1d    Det. By:   Previous Ultrasound      EDD:   08/01/16
(01/29/16)
Anatomy

Cranium:               Appears normal         Aortic Arch:            Appears normal
Cavum:                 Appears normal         Ductal Arch:            Appears normal
Ventricles:            Appears normal         Diaphragm:              Appears normal
Choroid Plexus:        Appears normal         Stomach:                Appears normal, left
sided
Cerebellum:            Appears normal         Abdomen:                Appears normal
Posterior Fossa:       Not well visualized    Abdominal Wall:         Not well visualized
Nuchal Fold:           Not applicable (>20    Cord Vessels:           Appears normal (3
wks GA)                                        vessel cord)
Face:                  Appears normal         Kidneys:                Appear normal
(orbits and profile)
Lips:                  Appears normal         Bladder:                Appears normal
Thoracic:              Appears normal         Spine:                  Limited views
appear normal
Heart:                 Appears normal         Upper Extremities:      Present
(4CH, axis, and situs
RVOT:                  Appears normal         Lower Extremities:      Present
LVOT:                  Appears normal

Other:  Fetus appears to be a male. Nasal bone visualized. Technically
difficult due to advanced gestational age.
Cervix Uterus Adnexa

Cervix
Not visualized (advanced GA >20wks)

Uterus
No abnormality visualized.

Left Ovary
Within normal limits.

Right Ovary
Within normal limits.

Adnexa:       No abnormality visualized.
Impression

SIUP at 38+1 weeks
Cephalic presentation
Normal detailed fetal anatomy; limited views of PF, CI and
details of extremities
Normal amniotic fluid volume
Measurements consistent with prior US; EFW at the 84th
%tile
Recommendations

Continue twice weekly NSTs with weekly AFIs
Follow-up as clinically indicated

## 2020-04-09 ENCOUNTER — Other Ambulatory Visit: Payer: Medicaid Other

## 2020-04-09 ENCOUNTER — Other Ambulatory Visit: Payer: Self-pay

## 2020-04-09 DIAGNOSIS — Z20822 Contact with and (suspected) exposure to covid-19: Secondary | ICD-10-CM

## 2020-04-11 LAB — NOVEL CORONAVIRUS, NAA: SARS-CoV-2, NAA: NOT DETECTED
